# Patient Record
Sex: Female | Born: 1995 | Race: Black or African American | Hispanic: No | Marital: Single | Smoking: Never smoker
Health system: Southern US, Community
[De-identification: ages and names within clinical notes are randomized; demographics above are authoritative.]

## PROBLEM LIST (undated history)

## (undated) DIAGNOSIS — J309 Allergic rhinitis, unspecified: Secondary | ICD-10-CM

## (undated) DIAGNOSIS — G43109 Migraine with aura, not intractable, without status migrainosus: Secondary | ICD-10-CM

## (undated) DIAGNOSIS — Z8709 Personal history of other diseases of the respiratory system: Secondary | ICD-10-CM

## (undated) DIAGNOSIS — D649 Anemia, unspecified: Secondary | ICD-10-CM

## (undated) HISTORY — PX: LASIK: SHX215

## (undated) HISTORY — PX: MOUTH SURGERY: SHX715

## (undated) HISTORY — PX: WISDOM TOOTH EXTRACTION: SHX21

## (undated) HISTORY — DX: Allergic rhinitis, unspecified: J30.9

## (undated) HISTORY — DX: Personal history of other diseases of the respiratory system: Z87.09

## (undated) HISTORY — DX: Migraine with aura, not intractable, without status migrainosus: G43.109

---
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
## 2013-01-06 ENCOUNTER — Ambulatory Visit: Payer: BC Managed Care – PPO | Admitting: Family Medicine

## 2013-02-03 ENCOUNTER — Ambulatory Visit (INDEPENDENT_AMBULATORY_CARE_PROVIDER_SITE_OTHER): Payer: BC Managed Care – PPO

## 2013-02-03 DIAGNOSIS — Z23 Encounter for immunization: Secondary | ICD-10-CM

## 2013-02-04 ENCOUNTER — Telehealth: Payer: Self-pay | Admitting: Family Medicine

## 2013-02-04 NOTE — Telephone Encounter (Signed)
Form faxed to number listed below. Mom was notified. 

## 2013-02-04 NOTE — Telephone Encounter (Signed)
Patient needs copy of shot record faxed to father    Fax number [phone]

## 2013-07-23 ENCOUNTER — Encounter: Payer: Self-pay | Admitting: Family Medicine

## 2013-07-23 ENCOUNTER — Ambulatory Visit (INDEPENDENT_AMBULATORY_CARE_PROVIDER_SITE_OTHER): Payer: BC Managed Care – PPO | Admitting: Family Medicine

## 2013-07-23 VITALS — BP 106/62 | Ht 64.0 in | Wt 152.5 lb

## 2013-07-23 DIAGNOSIS — D649 Anemia, unspecified: Secondary | ICD-10-CM

## 2013-07-23 DIAGNOSIS — N920 Excessive and frequent menstruation with regular cycle: Secondary | ICD-10-CM

## 2013-07-23 DIAGNOSIS — D509 Iron deficiency anemia, unspecified: Secondary | ICD-10-CM

## 2013-07-23 DIAGNOSIS — F5089 Other specified eating disorder: Secondary | ICD-10-CM

## 2013-07-23 LAB — POCT HEMOGLOBIN: Hemoglobin: 6.8 g/dL — AB (ref 12.2–16.2)

## 2013-07-23 MED ORDER — LEVONORGEST-ETH ESTRAD 91-DAY 0.15-0.03 MG PO TABS: 1.0000 | ORAL_TABLET | Freq: Every day | ORAL | Status: DC

## 2013-07-23 NOTE — Progress Notes (Signed)
   Subjective:    Patient ID: Faith Huff, female    DOB: 11/29/1995, 18 y.o.   MRN: [id number]  HPI Patient is here today because she has been experiencing fatigue and lightheadedness for about several months now. She thinks that it is related to her anemia because her iron levels run low and she takes daily iron supplements for this. Patient's hemoglobin today is 6.8. Patient has a disorder called pica and has been eating and craving soap.   Feels weak with exertion  Menses are not very heavy but protrated  Not sexually active  Takes iron tabs on occasion  Reports diminished energy progressive over a number of months. Cycles at times her heavy. Also associated cramping. Patient has come off the birth control pills recently. She just restarted in a pack and still has a couple weeks of the Seasonale left.  Results for orders placed in visit on 07/23/13  POCT HEMOGLOBIN      Result Value Range   Hemoglobin 6.8 (*) 12.2 - 16.2 g/dL     Review of Systems  no chest pain no headache no back pain no change in bowel habits ROS otherwise negative    Objective:   Physical Exam  Alert no apparent distress. H&T normal conjunctiva pale. Lungs clear. Heart regular in rhythm. Abdomen soft ankles without edema      Assessment & Plan:  Impression 1 severe anemia discussed at length. Easily 25 minutes spent most in discussion. Anemia is multifactorial #2 pica nature of this discussed. Need to have been in pica habits discussed at length. #3 menorrhagia contributing to the anemia. #4 diet patient states she never eats red meat and mostly vegetables. Plan iron gluconate tablets twice a day faithfully. Multivitamin with folic acid daily faithfully. Maintain Seasonale rationale discussed. Recheck in one month we'll repeat hemoglobin then. WSL

## 2013-07-23 NOTE — Patient Instructions (Signed)
Iron gluconate tablet, take one tab twice daily faithfull  multivit with folic acid take one each morn faithfully  Stop uncommon soap intake  Maintain birth control pills  Come back in 4 to 6 wks as scheduled

## 2013-08-28 ENCOUNTER — Ambulatory Visit: Payer: BC Managed Care – PPO | Admitting: Family Medicine

## 2013-09-21 ENCOUNTER — Encounter: Payer: Self-pay | Admitting: Family Medicine

## 2013-09-21 ENCOUNTER — Ambulatory Visit (INDEPENDENT_AMBULATORY_CARE_PROVIDER_SITE_OTHER): Payer: BC Managed Care – PPO | Admitting: Family Medicine

## 2013-09-21 VITALS — BP 120/78 | Ht 64.0 in | Wt 154.6 lb

## 2013-09-21 DIAGNOSIS — D509 Iron deficiency anemia, unspecified: Secondary | ICD-10-CM

## 2013-09-21 DIAGNOSIS — D649 Anemia, unspecified: Secondary | ICD-10-CM

## 2013-09-21 DIAGNOSIS — F5089 Other specified eating disorder: Secondary | ICD-10-CM

## 2013-09-21 LAB — POCT HEMOGLOBIN: Hemoglobin: 10.6 g/dL — AB (ref 12.2–16.2)

## 2013-09-21 NOTE — Progress Notes (Signed)
   Subjective:    Patient ID: Faith Huff, female    DOB: 10/19/1995, 18 y.o.   MRN: [id number]  HPI Patient arrives for a follow up on her hemoglobin. Hgb much improved at 10.6 from 6.8 at last visit. Patient reports she is feeling better and still taking her iron tablets. Results for orders placed in visit on 09/21/13  POCT HEMOGLOBIN      Result Value Ref Range   Hemoglobin 10.6 (*) 12.2 - 16.2 g/dL   Took cut back , Less sob no patient also has irregular periods when starting the new birth control pill.  Trying to experience less plica.  No easy bleeding anywhere.  Energy level overall improved. Review of Systems No chest pain no back pain no abdominal pain no change about habits no blood in stool    Objective:   Physical Exam  Alert no apparent distress. Lungs clear. Heart regular in rhythm. H&T normal.      Assessment & Plan:  Impression anemia much improved plan not down iron tablets one daily. Your menstrual cycles discussed maintain same birth control pill for full 90 day cycle. Rationale discussed.

## 2013-12-11 ENCOUNTER — Telehealth: Payer: Self-pay | Admitting: Family Medicine

## 2013-12-11 NOTE — Telephone Encounter (Signed)
Patient needs shot record. °

## 2013-12-11 NOTE — Telephone Encounter (Signed)
Shot record printed and left up front for pick up. Patient notified.

## 2014-02-02 ENCOUNTER — Ambulatory Visit (INDEPENDENT_AMBULATORY_CARE_PROVIDER_SITE_OTHER): Payer: BC Managed Care – PPO | Admitting: Family Medicine

## 2014-02-02 ENCOUNTER — Encounter: Payer: Self-pay | Admitting: Family Medicine

## 2014-02-02 VITALS — BP 118/80 | Temp 98.5°F | Ht 64.0 in | Wt 150.4 lb

## 2014-02-02 DIAGNOSIS — D649 Anemia, unspecified: Secondary | ICD-10-CM

## 2014-02-02 LAB — POCT HEMOGLOBIN: Hemoglobin: 10.1 g/dL — AB (ref 12.2–16.2)

## 2014-02-02 MED ORDER — NORETHINDRONE-ETH ESTRADIOL 1-35 MG-MCG PO TABS: 1.0000 | ORAL_TABLET | Freq: Every day | ORAL | Status: DC

## 2014-02-02 NOTE — Progress Notes (Signed)
   Subjective:    Patient ID: Faith MorrowGloriah H Shrieves, female    DOB: 12/24/1995, 18 y.o.   MRN: [id number]  HPI Patient is here today because the current birth control pill she is taking does not seem to be working. Patient states that her period has been on for over 1 month now. She suffers with anemia and feels that this is not helping it at all.  Patient states she has no other concerns at this time.  Hemoglobin is 10.1.  Light for several weeks   Exercises once per wk  Energy level or decent  Avoiding soap etc.    Takes one iron tab per day, visited per wk.  Results for orders placed in visit on 02/02/14  POCT HEMOGLOBIN      Result Value Ref Range   Hemoglobin 10.1 (*) 12.2 - 16.2 g/dL    Review of Systems No chest pain no headache no back pain no abdominal pain no change in bowel habits    Objective:   Physical Exam  Alert no apparent distress. Vitals stable. HEENT normal. Lungs clear. Heart regular in rhythm.      Assessment & Plan:  Impression iron deficiency anemia still suboptimal in. Multifactorial in etiology. #2 menorrhagia. #3 plica result per patient plan stop Seasonale. Start Ortho-Novum one/35. Increase iron gluconate to twice a day. Add multivitamin daily until further notice. WSL

## 2014-02-02 NOTE — Patient Instructions (Signed)
Increase iron gluconate to twice per day  Add multivitamin daily

## 2015-11-28 ENCOUNTER — Other Ambulatory Visit (HOSPITAL_COMMUNITY): Payer: Self-pay | Admitting: Oncology

## 2015-11-28 ENCOUNTER — Encounter: Payer: Self-pay | Admitting: Family Medicine

## 2015-11-28 ENCOUNTER — Ambulatory Visit (INDEPENDENT_AMBULATORY_CARE_PROVIDER_SITE_OTHER): Payer: BLUE CROSS/BLUE SHIELD | Admitting: Family Medicine

## 2015-11-28 VITALS — BP 110/60 | Temp 98.5°F | Ht 64.0 in | Wt 150.4 lb

## 2015-11-28 DIAGNOSIS — D649 Anemia, unspecified: Secondary | ICD-10-CM

## 2015-11-28 DIAGNOSIS — R5383 Other fatigue: Secondary | ICD-10-CM | POA: Diagnosis not present

## 2015-11-28 DIAGNOSIS — D509 Iron deficiency anemia, unspecified: Secondary | ICD-10-CM

## 2015-11-28 DIAGNOSIS — Z8659 Personal history of other mental and behavioral disorders: Secondary | ICD-10-CM | POA: Diagnosis not present

## 2015-11-28 LAB — POCT HEMOGLOBIN: Hemoglobin: 5.5 g/dL — AB (ref 12.2–16.2)

## 2015-11-28 NOTE — Patient Instructions (Signed)
Iron sulfate 325 mg equivalent two tabs twice per day (ask pharmacist)  multivit with folic acid (one mg) one each morn  Three aleave twice per d with food during menstrual cycle  rec consider not working if you continue to feel bad because your hgb is very low which makes you prone to fatigue and lightheadedness

## 2015-11-28 NOTE — Progress Notes (Signed)
   Subjective:    Patient ID: Faith Huff, female    DOB: 12/18/1995, 20 y.o.   MRN: [id number]  HPI Patient is here today for extreme fatigue. Onset several months ago. Patient has no other concerns at this time.   This morn felt lightheaded, took three iron tabs,   [phone]  Results for orders placed or performed in visit on 11/28/15  POCT hemoglobin  Result Value Ref Range   Hemoglobin 5.5 (A) 12.2 - 16.2 g/dL   Patient has history of fatigue and tiredness. Next  Also history of hypo-. Next  History menorrhagia. Used birth control pills 4. But then did not like to wait making her feel.  Substantial he has history of pica. For this patient was primarily soap, patient claims she has not gone back to soak this time  A lot of stress this semester. With fatigue and tiredness has not been able to focuses well in school. Findings result getting the honor classes.  Review of Systems Lightheaded spell today. Felt like she might want to pass out. Working in a [employer] industry no headache no chest pain no back pain    Objective:   Physical Exam  Alert vital stable HEENT slightly pale eyelids pharynx normal neck supple lungs clear. Heart regular in rhythm affect appropriate      Assessment & Plan:  Impression severe anemia though patient denying pica this is often a secondary feature during periods of stress with folks with an underlying tendency, would not be surprised if hard of the prominent this time. #2 history of iron deficiency #3 menorrhagia plan iron supplement plus folic acid and multivitamin. Family asks I dictated a letter regarding her poor academic performance this spring in light of her medical problems. I'll do this. Also we'll set up hematology referral due to recurrent and sometimes severe nature patient's anemia.

## 2015-11-29 ENCOUNTER — Encounter (HOSPITAL_COMMUNITY): Payer: BLUE CROSS/BLUE SHIELD | Attending: Hematology & Oncology

## 2015-11-29 ENCOUNTER — Other Ambulatory Visit: Payer: Self-pay | Admitting: *Deleted

## 2015-11-29 ENCOUNTER — Telehealth: Payer: Self-pay | Admitting: Family Medicine

## 2015-11-29 DIAGNOSIS — D649 Anemia, unspecified: Secondary | ICD-10-CM | POA: Diagnosis not present

## 2015-11-29 DIAGNOSIS — N920 Excessive and frequent menstruation with regular cycle: Secondary | ICD-10-CM | POA: Insufficient documentation

## 2015-11-29 DIAGNOSIS — D509 Iron deficiency anemia, unspecified: Secondary | ICD-10-CM

## 2015-11-29 LAB — BASIC METABOLIC PANEL
BUN/Creatinine Ratio: 10 (ref 9–23)
BUN: 6 mg/dL (ref 6–20)
CO2: 24 mmol/L (ref 18–29)
Calcium: 9.3 mg/dL (ref 8.7–10.2)
Chloride: 104 mmol/L (ref 96–106)
Creatinine, Ser: 0.6 mg/dL (ref 0.57–1.00)
GFR calc Af Amer: 151 mL/min/{1.73_m2} (ref >59)
GFR calc non Af Amer: 131 mL/min/{1.73_m2} (ref >59)
Glucose: 99 mg/dL (ref 65–99)
Potassium: 4.3 mmol/L (ref 3.5–5.2)
Sodium: 142 mmol/L (ref 134–144)

## 2015-11-29 LAB — HEPATIC FUNCTION PANEL
ALT: 6 IU/L (ref 0–32)
AST: 11 IU/L (ref 0–40)
Albumin: 4.4 g/dL (ref 3.5–5.5)
Alkaline Phosphatase: 53 IU/L (ref 39–117)
Bilirubin Total: <0.2 mg/dL (ref 0.0–1.2)
Bilirubin, Direct: 0.07 mg/dL (ref 0.00–0.40)
Total Protein: 7.1 g/dL (ref 6.0–8.5)

## 2015-11-29 LAB — CBC WITH DIFFERENTIAL/PLATELET
Basophils Absolute: 0.1 10*3/uL (ref 0.0–0.2)
Basos: 1 %
EOS (ABSOLUTE): 0.1 10*3/uL (ref 0.0–0.4)
Eos: 1 %
Hematocrit: 27.2 % — ABNORMAL LOW (ref 34.0–46.6)
Hemoglobin: 7.5 g/dL — ABNORMAL LOW (ref 11.1–15.9)
Immature Grans (Abs): 0 10*3/uL (ref 0.0–0.1)
Immature Granulocytes: 0 %
Lymphocytes Absolute: 1.9 10*3/uL (ref 0.7–3.1)
Lymphs: 21 %
MCH: 17.4 pg — ABNORMAL LOW (ref 26.6–33.0)
MCHC: 27.6 g/dL — ABNORMAL LOW (ref 31.5–35.7)
MCV: 63 fL — ABNORMAL LOW (ref 79–97)
Monocytes Absolute: 0.7 10*3/uL (ref 0.1–0.9)
Monocytes: 7 %
Neutrophils Absolute: 6.4 10*3/uL (ref 1.4–7.0)
Neutrophils: 70 %
Platelets: 251 10*3/uL (ref 150–379)
RBC: 4.31 x10E6/uL (ref 3.77–5.28)
RDW: 19.9 % — ABNORMAL HIGH (ref 12.3–15.4)
WBC: 9.1 10*3/uL (ref 3.4–10.8)

## 2015-11-29 LAB — RETICULOCYTES
RBC.: 4.01 MIL/uL (ref 3.87–5.11)
Retic Count, Absolute: 112.3 10*3/uL (ref 19.0–186.0)
Retic Ct Pct: 2.8 % (ref 0.4–3.1)

## 2015-11-29 LAB — VITAMIN B12: Vitamin B-12: 284 pg/mL (ref 211–946)

## 2015-11-29 LAB — SAVE SMEAR

## 2015-11-29 LAB — LACTATE DEHYDROGENASE: LDH: 127 U/L (ref 98–192)

## 2015-11-29 LAB — FERRITIN: Ferritin: 7 ng/mL — ABNORMAL LOW (ref 15–150)

## 2015-11-29 LAB — FOLATE: Folate: 9.5 ng/mL (ref >3.0)

## 2015-11-29 LAB — TSH: TSH: 0.777 u[IU]/mL (ref 0.450–4.500)

## 2015-11-29 LAB — T4: T4, Total: 7.5 ug/dL (ref 4.5–12.0)

## 2015-11-29 NOTE — Telephone Encounter (Signed)
Ok prescrive prenatal type vit, if insure does not cover, ptc can go otc with folic acid but at a lesser dose

## 2015-11-29 NOTE — Telephone Encounter (Signed)
Script sent to pharm. Pt notified on voicemail.  

## 2015-11-29 NOTE — Telephone Encounter (Signed)
Multi vit w/folic acid 1 mg  The Pharmacist says she can not get that  OTC it will need to be prescribed   [facility]

## 2015-11-30 ENCOUNTER — Other Ambulatory Visit: Payer: Self-pay | Admitting: *Deleted

## 2015-11-30 ENCOUNTER — Telehealth: Payer: Self-pay | Admitting: Family Medicine

## 2015-11-30 DIAGNOSIS — N920 Excessive and frequent menstruation with regular cycle: Secondary | ICD-10-CM

## 2015-11-30 LAB — PATHOLOGIST SMEAR REVIEW

## 2015-11-30 LAB — HAPTOGLOBIN: Haptoglobin: 76 mg/dL (ref 34–200)

## 2015-11-30 NOTE — Telephone Encounter (Signed)
Pt was to get a letter from you to state that due to her  Extreme fatigue and sickness that it may have impacted her  Ability to do her school work as she was required to do.   Please advise when this will be done, they are calling this  Morning to inquire about it, mom states you told her it would Be done today.

## 2015-11-30 NOTE — Telephone Encounter (Signed)
Pt notified she can pickup letter after 1 today

## 2015-11-30 NOTE — Telephone Encounter (Signed)
i said it would be done today, was waiting for blood work to bolster the case, blood work in last night , letter will be ready by after lunch,

## 2015-12-02 ENCOUNTER — Telehealth: Payer: Self-pay | Admitting: Obstetrics and Gynecology

## 2015-12-02 NOTE — Telephone Encounter (Signed)
Called and left a message for patient to call back to schedule a new patient doctor referral. °

## 2015-12-06 NOTE — Telephone Encounter (Signed)
Called and left a message for patient to call back to schedule a new patient doctor referral. °

## 2015-12-07 ENCOUNTER — Ambulatory Visit (INDEPENDENT_AMBULATORY_CARE_PROVIDER_SITE_OTHER): Payer: BLUE CROSS/BLUE SHIELD | Admitting: Obstetrics and Gynecology

## 2015-12-07 ENCOUNTER — Encounter: Payer: Self-pay | Admitting: Obstetrics and Gynecology

## 2015-12-07 ENCOUNTER — Encounter (HOSPITAL_COMMUNITY): Payer: Self-pay | Admitting: Oncology

## 2015-12-07 ENCOUNTER — Encounter (HOSPITAL_BASED_OUTPATIENT_CLINIC_OR_DEPARTMENT_OTHER): Payer: BLUE CROSS/BLUE SHIELD | Admitting: Oncology

## 2015-12-07 ENCOUNTER — Ambulatory Visit: Payer: BLUE CROSS/BLUE SHIELD | Admitting: Obstetrics and Gynecology

## 2015-12-07 VITALS — BP 98/60 | HR 84 | Resp 15 | Ht 64.0 in | Wt 149.0 lb

## 2015-12-07 VITALS — BP 104/62 | HR 68 | Temp 97.9°F | Resp 20 | Ht 64.0 in | Wt 149.2 lb

## 2015-12-07 DIAGNOSIS — Z01419 Encounter for gynecological examination (general) (routine) without abnormal findings: Secondary | ICD-10-CM

## 2015-12-07 DIAGNOSIS — D509 Iron deficiency anemia, unspecified: Secondary | ICD-10-CM

## 2015-12-07 DIAGNOSIS — N920 Excessive and frequent menstruation with regular cycle: Secondary | ICD-10-CM | POA: Diagnosis not present

## 2015-12-07 DIAGNOSIS — F5089 Other specified eating disorder: Secondary | ICD-10-CM | POA: Diagnosis not present

## 2015-12-07 DIAGNOSIS — Z124 Encounter for screening for malignant neoplasm of cervix: Secondary | ICD-10-CM

## 2015-12-07 MED ORDER — NORETHIN ACE-ETH ESTRAD-FE 1-20 MG-MCG PO TABS: 1.0000 | ORAL_TABLET | Freq: Every day | ORAL | Status: DC

## 2015-12-07 MED ORDER — NAPROXEN SODIUM 550 MG PO TABS: ORAL_TABLET | ORAL | Status: DC

## 2015-12-07 NOTE — Assessment & Plan Note (Addendum)
Microcytic, hypochromic anemia with elevated RDW with preservation of WBC and platelet count dating back to at least 2011 with a documented ferritin level of 7 on 11/28/2015.  Also noted to have a low-normal B12 level.  Labs to be drawn the day of her first IV iron infusion to avoid repeated venipunctures: CBC diff, CMET, retic count, intrinsic factor antibody and anti-parietal cell antibody, ESR, CRP, pathology smear review.  B12 level is low normal.  Patient is advised to not start oral B-vitamin replacement at this time as it will alter antibody testing results.  She is not deficient.  She is educated that we will provide further recommendations in this regard at follow-up appointment (sooner if necessary).  She is currently on OTC ferrous sulfate without known intolerance issues.  She may continue with this if she wishes.   Stool cards x 3.  Calculated iron deficit is ~ 950 mg and therefore, we will administer 510 mg IV Feraheme x 2.  Patient and mother are provided education regarding iron deficiency anemia.  Patient and mother discussed birth control options.  I get the impression that the patient's mother may be concerned about compliance and therefore, targeted questions regarding depo-provera were asked from the patient's mother.  We think this is a reasonable birth control option to help menorrhagia and improve control of menstruation and cycles.  Labs in ~ 4-6 weeks: CBC diff, iron/TIBC, ferritin.  Continue follow-up with Gyn for evaluation/treatment/recommendations regarding menorrhagia.  She saw Dr. Talbert Nan today, 12/07/2015: A: Well Woman with normal exam Menorrhagia with severe anemia  P: She is seeing hematologist today to r/o other causes of anemia, recommend she have an iron transfusion Return for a gyn ultrasound (abdominal only) Discussed options for treating her menorrhagia, including OCP's, Micronor, depo-provera and  IUD's (would need to do with sedation) Anaprox for cramps  Return in 4-6 weeks for follow-up.  I will send a copy of today's note to Dr. Wolfgang Phoenix (PCP) and Dr. Talbert Nan (Gynecology).

## 2015-12-07 NOTE — Patient Instructions (Signed)

## 2015-12-07 NOTE — Progress Notes (Signed)
Patient ID: Faith MorrowGloriah H Arps, female   DOB: 09/23/1995, 20 y.o.   MRN: [id number] 20 y.o. [id number] SingleAfrican AmericanF here c/o heavy menstrual cycles and anemia.  Recent hgb of 7.5. Normal TSH. She has an appointment with hematology today. She has had anemia since middle school. She has been having fatigue unable to finish her school paper needed for graduation. She is not able to function normally. She had a pre-syncopal event a few weeks ago on the first day of her last period. Not SOB.  Period Cycle (Days): 28 Period Duration (Days): 5-6 days  Period Pattern: Regular Menstrual Flow: Heavy Menstrual Control: Maxi pad Menstrual Control Change Freq (Hours): changes pad every hour  Dysmenorrhea: (!) Severe Dysmenorrhea Symptoms: Cramping, Nausea  No BTB. Cramps are severe for 1 day. She takes Alieve. She was on OCP's for 6 months, didn't help her bleeding, felt premenstrual all the time. Never sexually active. She has never had a gyn ultrasound.   Patient's last menstrual period was 11/28/2015.          Sexually active: No.  The current method of family planning is abstinence.    Exercising: No.  The patient does not participate in regular exercise at present. Smoker:  no  Health Maintenance: Pap:  Never History of abnormal Pap:  N/A TDaP:  2013 Gardasil: completed all 3    reports that she has never smoked. She has never used smokeless tobacco. She reports that she does not drink alcohol or use illicit drugs.One paper away from Graduation. Majoring in elementary degree, ESL and Congohinese.   Past Medical History  Diagnosis Date  . Anemia   . Allergic rhinitis   . History of reactive airway disease     Past Surgical History  Procedure Laterality Date  . Wisdom tooth extraction    . Mouth surgery      Current Outpatient Prescriptions  Medication Sig Dispense Refill  . Iron TABS Take by mouth daily.     No current facility-administered medications for this visit.     History reviewed. No pertinent family history.  Review of Systems  Constitutional: Positive for fatigue.  HENT: Negative.   Eyes: Negative.   Respiratory: Negative.   Cardiovascular: Negative.   Gastrointestinal: Negative.   Endocrine: Negative.   Genitourinary: Positive for menstrual problem.       Heavy menstrual cycles   Musculoskeletal: Negative.   Skin: Negative.   Allergic/Immunologic: Negative.   Neurological: Negative.   Psychiatric/Behavioral: Negative.     Exam:   BP 98/60 mmHg  Pulse 84  Resp 15  Ht 5\' 4"  (1.626 m)  Wt 149 lb (67.586 kg)  BMI 25.56 kg/m2  LMP 11/28/2015  Weight change: @WEIGHTCHANGE @ Height:   Height: 5\' 4"  (162.6 cm)  Ht Readings from Last 3 Encounters:  12/07/15 5\' 4"  (1.626 m)  11/28/15 5\' 4"  (1.626 m)  02/02/14 5\' 4"  (1.626 m) (45 %*, Z = -0.12)   * Growth percentiles are based on CDC 2-20 Years data.    General appearance: alert, cooperative and appears stated age Head: Normocephalic, without obvious abnormality, atraumatic Neck: no adenopathy, supple, symmetrical, trachea midline and thyroid normal to inspection and palpation Lungs: clear to auscultation bilaterally Breasts: normal appearance, no masses or tenderness Heart: regular rate and rhythm Abdomen: soft, non-tender; bowel sounds normal; no masses,  no organomegaly Extremities: extremities normal, atraumatic, no cyanosis or edema Skin: Skin color, texture, turgor normal. No rashes or lesions Lymph nodes: Cervical, supraclavicular, and axillary nodes normal.  No abnormal inguinal nodes palpated Neurologic: Grossly normal   Pelvic: External genitalia:  no lesions              Urethra:  normal appearing urethra with no masses, tenderness or lesions              Bartholins and Skenes: normal                 Vagina: normal appearing vagina with normal color and discharge, no lesions. Able to tolerate a pediatric speculum              Cervix: no lesions                Bimanual Exam: done via rectal exam, patient unable to tolerate vaginal exam with 1 finger Uterus:  normal size, contour, position, consistency, mobility, non-tender              Adnexa: no mass, fullness, tenderness               Rectovaginal: Confirms               Anus:  normal sphincter tone, no lesions  Chaperone was present for exam.  A:  Well Woman with normal exam  Menorrhagia with severe anemia  P:   She is seeing hematologist today to r/o other causes of anemia, recommend she have an iron transfusion  Return for a gyn ultrasound (abdominal only)  Discussed options for treating her menorrhagia, including OCP's, Micronor, depo-provera and IUD's (would need to do with sedation)  She would like to try OCP's, no contraindications, risks reviewed  Anaprox for cramps  Pap done  No STD testing needed  In addition to the ultrasound, she should return in 3 months for a pill check

## 2015-12-07 NOTE — Patient Instructions (Addendum)
[place] at [facility]  Discharge Instructions:  Labs prior to IV iron infusion (CBC diff, retic count, intrinsic factor antibody, anti-parietal cell antibody, ESR, CRP, path smear review)  Stool cards x 3. Bring back with you when you return.   Feraheme 571m IV x 2 (1 week apart)  Return in 6 weeks for labs: CBC diff, Iron/TIBC, Ferritin  Return in 6 weeks for a f/u with Dr. PWhitney Muse    _______________________________________________________________  [facility] you [facility] and [facility] care.  To afford each patient quality time with our providers, please arrive at least 15 minutes before your scheduled appointment.  You need to re-schedule your appointment if you arrive 10 or more minutes late.  We strive to give you quality time with our providers, and arriving late affects you and other patients whose appointments are after yours.  Also, if you no show three or more times for appointments you may be dismissed from the clinic.  Again, thank you for choosing [facility] that these requests will allow you access to exceptional care and in a timely manner. _______________________________________________________________  If you have questions after your visit, please contact our office at [phone] between the hours of 8:30 a.m. and 5:00 p.m. Voicemails left after 4:30 p.m. will not be returned until the following business day. _______________________________________________________________  For prescription refill requests, have your pharmacy contact our office. _______________________________________________________________  Recommendations made by the consultant and any test results will be sent to your referring physician. _______________________________________________________________

## 2015-12-07 NOTE — Progress Notes (Signed)
[facility] Hematology/Oncology Consultation   Name: Faith Huff      MRN: [id number]    Location: Room/bed info not found  Date: 12/07/2015 Time:5:50 PM   REFERRING PHYSICIAN:  Dr. Wolfgang Phoenix (Primary Care Provider)  REASON FOR CONSULT:  Anemia   DIAGNOSIS:  Microcytic, hypochromic anemia with an elevated RDW with preservation of WBC and platelet counts.  Last documented ferritin is 7 on 11/28/2015.  HISTORY OF PRESENT ILLNESS:   Faith Huff is a 20 y.o. female with a medical history significant for menorrhagia who is referred to the [facility] for anemia.  I personally reviewed and went over laboratory results with the patient.  The results are noted within this dictation.  Labs are impressive for a microcytic, hypochromic anemia with elevated RDW with preservation of WBC and platelet count dating back to at least 2011.  I personally reviewed and went over radiographic studies with the patient.  The results are noted within this dictation.  Noncontributory for current diagnosis.  Chart reviewed.  She was sen by Gynecology today.  Dr. Gentry Fitz note is reviewed in detail: A: Well Woman with normal exam Menorrhagia with severe anemia  P: She is seeing hematologist today to r/o other causes of anemia, recommend she have an iron transfusion Return for a gyn ultrasound (abdominal only) Discussed options for treating her menorrhagia, including OCP's, Micronor, depo-provera and IUD's (would need to do with sedation) Anaprox for cramps  Review of Systems  Constitutional: Negative.  Negative for fever, chills, weight loss, malaise/fatigue and diaphoresis.  HENT: Negative.  Negative for congestion, nosebleeds and sore throat.   Eyes: Negative.  Negative for blurred vision and double vision.  Respiratory: Negative.  Negative for cough, hemoptysis, sputum production, shortness of breath,  wheezing and stridor.   Cardiovascular: Negative.  Negative for chest pain and leg swelling.  Gastrointestinal: Negative.  Negative for nausea, vomiting, abdominal pain, diarrhea, constipation, blood in stool and melena.  Genitourinary: Negative.  Negative for dysuria, urgency, frequency and hematuria.  Musculoskeletal: Negative.  Negative for back pain and falls.  Skin: Negative.  Negative for itching and rash.  Neurological: Negative.  Negative for dizziness, tingling, tremors, sensory change, speech change, focal weakness, loss of consciousness and weakness.  Endo/Heme/Allergies: Negative.  Does not bruise/bleed easily.  Psychiatric/Behavioral: Negative.  Negative for substance abuse.   She reports menorrhagia and denies vaginal bleeding between her menstrual cycles.  She notes that her [facility] was on 11/27/2015.  She notes using 10-12 tampons/pads during her heavy flow days.  Her periods last about 5 days and she is regular every 28-32 days.  She notes that her heavy flows are 3 out of 5 days of her menses.  She denies any pregnancy.  She reports menstruation beginning in 5th grade.  She reports craving soap bars, favoring the [place] brand.  The last time she ate soap was in the summer 2016.   She denies craving soap today.  She denies craving liquid soap.  She denies ice, clay, dirt, concrete, starch cravings.  The patient and her mother who accompanied the patient today are provided education regarding anemia and more specifically iron deficiency anemia.  Iron deficiency anemia is the most common anemia.  Beside playing a critical role as an oxygen carrier in the heme group of hemoglobin, iron is found in many key proteins in the cells, such as cytochromes and myoglobin, so it is not unexpected that a lack of  iron has effects other than anemia.  Three studies have focused on nonanemic iron deficiency leading to fatigue.  Two studies showed that oral iron supplementation reduces fatigue, with no  significant change in hemoglobin levels, in women with a ferritin level of less than 50 ng/mL, and a third study showed a lessening of fatigue with parental iron administration in women with a ferritin level of 15 ng/mL or less or an iron saturation of 20% or less.   Owing to obligate iron loss through menses, women are at greater risk for iron deficiency than men.  Iron loss in all women averages 1-3 ng per day, and dietary intake is often inadequate to maintain a positive iron balance.  A 1967 study showed that 25% of healthy, college-age women had no bone marrow iron stores and that another 33% had low stores.    -NEJM Volume 371, No 14, pg 1325-1326   PAST MEDICAL HISTORY:   Past Medical History  Diagnosis Date  . Anemia   . Allergic rhinitis   . History of reactive airway disease     ALLERGIES: No Known Allergies    MEDICATIONS: I have reviewed the patient's current medications.    Current Outpatient Prescriptions on File Prior to Visit  Medication Sig Dispense Refill  . Iron TABS Take by mouth daily.     No current facility-administered medications on file prior to visit.     PAST SURGICAL HISTORY Past Surgical History  Procedure Laterality Date  . Wisdom tooth extraction    . Mouth surgery      FAMILY HISTORY: Family History  Problem Relation Age of Onset  . Anemia Mother   Mother is 74 years old and has IDA. Father is 28 years old.  SOCIAL HISTORY:  reports that she has never smoked. She has never used smokeless tobacco. She reports that she does not drink alcohol or use illicit drugs.  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    Social History   Social History  . Marital Status: Single    Spouse Name: N/A  . Number of Children: N/A  . Years of Education: N/A   Social History Main Topics  . Smoking status: Never Smoker   . Smokeless tobacco: Never Used  . Alcohol Use: No  . Drug Use: No  . Sexual Activity: No     Other Topics Concern  . None   Social History Narrative    PERFORMANCE STATUS: The patient's performance status is 0 - Asymptomatic  PHYSICAL EXAM: Most Recent Vital Signs: Blood pressure 104/62, pulse 68, temperature 97.9 F (36.6 C), temperature source Oral, resp. rate 20, height '5\' 4"'$  (1.626 m), weight 149 lb 3.2 oz (67.677 kg), last menstrual period 11/28/2015, SpO2 100 %. General appearance: alert, cooperative, appears stated age, no distress and anxious, accompanied by her mother. Head: Normocephalic, without obvious abnormality, atraumatic Eyes: negative findings: lids and lashes normal, conjunctivae and sclerae normal and corneas clear Throat: lips, mucosa, and tongue normal; teeth and gums normal Neck: no adenopathy, supple, symmetrical, trachea midline and thyroid not enlarged, symmetric, no tenderness/mass/nodules Lungs: clear to auscultation bilaterally and normal percussion bilaterally Heart: regular rate and rhythm, S1, S2 normal, no murmur, click, rub or gallop Abdomen: soft, non-tender; bowel sounds normal; no masses,  no organomegaly Extremities: extremities normal, atraumatic, no cyanosis or edema Skin: Skin color, texture, turgor normal. No rashes or lesions or henna tatooing on her upper extremitites bilaterally. Lymph nodes: Cervical,  supraclavicular, and axillary nodes normal. Neurologic: Grossly normal  LABORATORY DATA:  CBC    Component Value Date/Time   WBC 9.1 11/28/2015 1547   WBC 6.7 11/09/2011 1749   RBC 4.01 11/29/2015 1320   RBC 4.31 11/28/2015 1547   RBC 4.73 11/09/2011 1749   HGB 5.5* 11/28/2015 1500   HGB 12.6 11/09/2011 1816   HCT 27.2* 11/28/2015 1547   HCT 37.0 11/09/2011 1816   PLT 251 11/28/2015 1547   PLT 476* 11/09/2011 1749   MCV 63* 11/28/2015 1547   MCV 71.5* 11/09/2011 1749   MCH 17.4* 11/28/2015 1547   MCH 22.0* 11/09/2011 1749   MCHC 27.6* 11/28/2015 1547   MCHC 30.8* 11/09/2011 1749   RDW 19.9* 11/28/2015 1547   RDW  17.2* 11/09/2011 1749   LYMPHSABS 1.9 11/28/2015 1547   LYMPHSABS 0.4* 01/07/2010 1625   MONOABS 0.5 01/07/2010 1625   EOSABS 0.1 11/28/2015 1547   EOSABS 0.0 01/07/2010 1625   BASOSABS 0.1 11/28/2015 1547   BASOSABS 0.0 01/07/2010 1625      Chemistry      Component Value Date/Time   NA 142 11/28/2015 1547   NA 142 11/09/2011 1816   K 4.3 11/28/2015 1547   CL 104 11/28/2015 1547   CO2 24 11/28/2015 1547   BUN 6 11/28/2015 1547   BUN 9 11/09/2011 1816   CREATININE 0.60 11/28/2015 1547      Component Value Date/Time   CALCIUM 9.3 11/28/2015 1547   ALKPHOS 53 11/28/2015 1547   AST 11 11/28/2015 1547   ALT 6 11/28/2015 1547   BILITOT <0.2 11/28/2015 1547   BILITOT 0.5 01/07/2010 1625     Lab Results  Component Value Date   IRON 27* 09/04/2009   TIBC 456 09/04/2009   FERRITIN 7* 11/28/2015   Lab Results  Component Value Date   AVWUJWJX91 478 11/28/2015   Lab Results  Component Value Date   FOLATE 9.5 11/28/2015   Lab Results  Component Value Date   LDH 127 11/29/2015     RADIOGRAPHY: No results found.     PATHOLOGY:  N/A  ASSESSMENT/PLAN:  Menorrhagia Anemia, iron deficiency PICA  Microcytic, hypochromic anemia with elevated RDW with preservation of WBC and platelet count dating back to at least 2011 with a documented ferritin level of 7 on 11/28/2015.  Also noted to have a low-normal B12 level.  Labs to be drawn the day of her first IV iron infusion to avoid repeated venipunctures: CBC diff, CMET, retic count, intrinsic factor antibody and anti-parietal cell antibody, ESR, CRP, pathology smear review.  B12 level is low normal.  Patient is advised to not start oral B-vitamin replacement at this time as it will alter antibody testing results.  We will begin B12 replacement in the next few days when she returns for iron replacement. She is not severely deficient. She is educated that we will provide further recommendations in this regard at follow-up  appointment (sooner if necessary).  She is currently on OTC ferrous sulfate without known intolerance issues.  She may continue with this if she wishes. Not sure that she can take this tid as would be needed for replacement and ongoing losses.  Stool cards x 3.  Calculated iron deficit is ~ 950 mg and therefore, we will administer 510 mg IV Feraheme x 2.  Patient and mother are provided education regarding iron deficiency anemia.  Patient and mother discussed birth control options.  I get the impression that the patient's mother may be  concerned about compliance and therefore, targeted questions regarding depo-provera were asked from the patient's mother.  We think this is a reasonable birth control option to help menorrhagia and improve control of menstruation and cycles.  Labs in ~ 4-6 weeks: CBC diff, iron/TIBC, ferritin.  Continue follow-up with Gyn for evaluation/treatment/recommendations regarding menorrhagia.  She saw Dr. Talbert Nan today, 12/07/2015: A: Well Woman with normal exam Menorrhagia with severe anemia  P: She is seeing hematologist today to r/o other causes of anemia, recommend she have an iron transfusion Return for a gyn ultrasound (abdominal only) Discussed options for treating her menorrhagia, including OCP's, Micronor, depo-provera and IUD's (would need to do with sedation) Anaprox for cramps  Return in 4-6 weeks for follow-up.  I will send a copy of today's note to Dr. Wolfgang Phoenix (PCP) and Dr. Talbert Nan (Gynecology).    ORDERS PLACED FOR THIS ENCOUNTER: Orders Placed This Encounter  Procedures  . CBC with Differential  . Comprehensive metabolic panel  . Haptoglobin  . Intrinsic Factor Antibodies  . Anti-parietal antibody  . Reticulocytes  . Sedimentation rate  . Pathologist smear review  . C-reactive protein  . Occult blood card to lab, stool  . Occult blood card to lab, stool  . Occult blood card  to lab, stool  . CBC with Differential  . Comprehensive metabolic panel  . Iron and TIBC  . Ferritin    MEDICATIONS PRESCRIBED THIS ENCOUNTER: No orders of the defined types were placed in this encounter.    All questions were answered. The patient knows to call the clinic with any problems, questions or concerns. We can certainly see the patient much sooner if necessary.  This note is electronically signed by Molli Hazard, MD  12/07/2015 5:50 PM

## 2015-12-08 ENCOUNTER — Telehealth: Payer: Self-pay | Admitting: Obstetrics and Gynecology

## 2015-12-08 LAB — IPS PAP SMEAR ONLY

## 2015-12-08 NOTE — Telephone Encounter (Signed)
Spoke with pt regarding benefit for ultrasound. Patient understood and agreeable. Patient advises, she would like to discuss with her parents, this evening before scheduling. Patient will call back on 12/09/15 to schedule

## 2015-12-08 NOTE — Telephone Encounter (Signed)
Called patient to review benefits for a recommended procedure, however voicemail (this is the only number on file) was not set up and I was unable to leave a message.

## 2015-12-09 NOTE — Telephone Encounter (Signed)
Called patient to follow up on scheduling recommended ultrasound, however, I was unable to leaved a message due to voicemail not being set up

## 2015-12-11 ENCOUNTER — Encounter (HOSPITAL_COMMUNITY): Payer: Self-pay | Admitting: Oncology

## 2015-12-14 ENCOUNTER — Encounter (HOSPITAL_BASED_OUTPATIENT_CLINIC_OR_DEPARTMENT_OTHER): Payer: BLUE CROSS/BLUE SHIELD

## 2015-12-14 VITALS — BP 102/65 | HR 74 | Temp 98.2°F | Resp 18 | Ht 64.0 in | Wt 152.2 lb

## 2015-12-14 DIAGNOSIS — D509 Iron deficiency anemia, unspecified: Secondary | ICD-10-CM | POA: Diagnosis not present

## 2015-12-14 DIAGNOSIS — D649 Anemia, unspecified: Secondary | ICD-10-CM | POA: Diagnosis not present

## 2015-12-14 LAB — RETICULOCYTES
RBC.: 4.75 MIL/uL (ref 3.87–5.11)
Retic Count, Absolute: 118.8 10*3/uL (ref 19.0–186.0)
Retic Ct Pct: 2.5 % (ref 0.4–3.1)

## 2015-12-14 LAB — COMPREHENSIVE METABOLIC PANEL
ALT: 10 U/L — ABNORMAL LOW (ref 14–54)
AST: 17 U/L (ref 15–41)
Albumin: 4 g/dL (ref 3.5–5.0)
Alkaline Phosphatase: 47 U/L (ref 38–126)
Anion gap: 8 (ref 5–15)
BUN: 9 mg/dL (ref 6–20)
CO2: 26 mmol/L (ref 22–32)
Calcium: 9.2 mg/dL (ref 8.9–10.3)
Chloride: 104 mmol/L (ref 101–111)
Creatinine, Ser: 0.55 mg/dL (ref 0.44–1.00)
GFR calc Af Amer: >60 mL/min (ref >60)
GFR calc non Af Amer: >60 mL/min (ref >60)
Glucose, Bld: 62 mg/dL — ABNORMAL LOW (ref 65–99)
Potassium: 3.5 mmol/L (ref 3.5–5.1)
Sodium: 138 mmol/L (ref 135–145)
Total Bilirubin: 0.5 mg/dL (ref 0.3–1.2)
Total Protein: 7.5 g/dL (ref 6.5–8.1)

## 2015-12-14 LAB — C-REACTIVE PROTEIN: CRP: <0.5 mg/dL (ref <1.0)

## 2015-12-14 LAB — CBC WITH DIFFERENTIAL/PLATELET
Basophils Absolute: 0.1 10*3/uL (ref 0.0–0.1)
Basophils Relative: 2 %
Eosinophils Absolute: 0.1 10*3/uL (ref 0.0–0.7)
Eosinophils Relative: 2 %
HCT: 35.6 % — ABNORMAL LOW (ref 36.0–46.0)
Hemoglobin: 10.1 g/dL — ABNORMAL LOW (ref 12.0–15.0)
Lymphocytes Relative: 37 %
Lymphs Abs: 1.8 10*3/uL (ref 0.7–4.0)
MCH: 21.3 pg — ABNORMAL LOW (ref 26.0–34.0)
MCHC: 28.4 g/dL — ABNORMAL LOW (ref 30.0–36.0)
MCV: 74.9 fL — ABNORMAL LOW (ref 78.0–100.0)
Monocytes Absolute: 0.4 10*3/uL (ref 0.1–1.0)
Monocytes Relative: 8 %
Neutro Abs: 2.5 10*3/uL (ref 1.7–7.7)
Neutrophils Relative %: 51 %
Platelets: 586 10*3/uL — ABNORMAL HIGH (ref 150–400)
RBC: 4.75 MIL/uL (ref 3.87–5.11)
WBC: 4.9 10*3/uL (ref 4.0–10.5)

## 2015-12-14 LAB — SEDIMENTATION RATE: Sed Rate: 4 mm/hr (ref 0–22)

## 2015-12-14 LAB — OCCULT BLOOD X 1 CARD TO LAB, STOOL
Fecal Occult Bld: NEGATIVE
Fecal Occult Bld: NEGATIVE
Fecal Occult Bld: NEGATIVE

## 2015-12-14 MED ORDER — FERUMOXYTOL INJECTION 510 MG/17 ML
510.0000 mg | Freq: Once | INTRAVENOUS | Status: AC
  Administered 2015-12-14: 510 mg via INTRAVENOUS
  Filled 2015-12-14: qty 17

## 2015-12-14 MED ORDER — SODIUM CHLORIDE 0.9 % IV SOLN
Freq: Once | INTRAVENOUS | Status: AC
  Administered 2015-12-14: 09:00:00 via INTRAVENOUS

## 2015-12-14 NOTE — Progress Notes (Signed)
Faith Huff tolerated Iron infusion well today and discharged after being monitored for 30 minutes upon completion of therapy. NAD noted.

## 2015-12-15 LAB — INTRINSIC FACTOR ANTIBODIES: Intrinsic Factor: 1 AU/mL (ref 0.0–1.1)

## 2015-12-15 LAB — PATHOLOGIST SMEAR REVIEW

## 2015-12-15 LAB — ANTI-PARIETAL ANTIBODY: Parietal Cell Antibody-IgG: 14.1 Units (ref 0.0–20.0)

## 2015-12-15 LAB — HAPTOGLOBIN: Haptoglobin: 62 mg/dL (ref 34–200)

## 2015-12-21 ENCOUNTER — Encounter (HOSPITAL_COMMUNITY): Payer: BLUE CROSS/BLUE SHIELD | Attending: Hematology & Oncology

## 2015-12-21 VITALS — BP 109/62 | HR 79 | Temp 98.6°F | Resp 18

## 2015-12-21 DIAGNOSIS — D509 Iron deficiency anemia, unspecified: Secondary | ICD-10-CM

## 2015-12-21 DIAGNOSIS — N920 Excessive and frequent menstruation with regular cycle: Secondary | ICD-10-CM | POA: Insufficient documentation

## 2015-12-21 DIAGNOSIS — D649 Anemia, unspecified: Secondary | ICD-10-CM | POA: Insufficient documentation

## 2015-12-21 MED ORDER — SODIUM CHLORIDE 0.9 % IV SOLN
Freq: Once | INTRAVENOUS | Status: AC
  Administered 2015-12-21: 15:00:00 via INTRAVENOUS

## 2015-12-21 MED ORDER — SODIUM CHLORIDE 0.9 % IV SOLN
510.0000 mg | Freq: Once | INTRAVENOUS | Status: AC
  Administered 2015-12-21: 510 mg via INTRAVENOUS
  Filled 2015-12-21: qty 17

## 2015-12-21 NOTE — Progress Notes (Signed)
Faith Huff Tolerated iron infusion. Discharged ambulatory after 30 minute wait period

## 2015-12-21 NOTE — Telephone Encounter (Signed)
Called patient to follow up and schedule requested ultrasound. Patient has declined to schedule at this time, states she still needs to speak with her parents before scheduling. Patient states she will call me back once she had spoken with her parents. Routing to Dr Oscar LaJertson for review.

## 2015-12-21 NOTE — Patient Instructions (Signed)
[facility] Discharge Instructions  RECOMMENDATIONS MADE BY THE CONSULTANT AND ANY TEST RESULTS WILL BE SENT TO YOUR REFERRING PHYSICIAN.  IV iron today Follow up as scheduled Please call the clinic if you have any questions or concerns   Thank you for choosing [facility] at [facility] to provide your oncology and hematology care.  To afford each patient quality time with our provider, please arrive at least 15 minutes before your scheduled appointment time.   Beginning January 23rd 2017 lab work for the Cancer Center will be done in the  Main lab at [place] on 1st floor. If you have a lab appointment with the Cancer Center please come in thru the  Main Entrance and check in at the main information desk  You need to re-schedule your appointment should you arrive 10 or more minutes late.  We strive to give you quality time with our providers, and arriving late affects you and other patients whose appointments are after yours.  Also, if you no show three or more times for appointments you may be dismissed from the clinic at the providers discretion.     Again, thank you for choosing [facility].  Our hope is that these requests will decrease the amount of time that you wait before being seen by our physicians.       _____________________________________________________________  Should you have questions after your visit to [facility], please contact our office at [phone] between the hours of 8:30 a.m. and 4:30 p.m.  Voicemails left after 4:30 p.m. will not be returned until the following business day.  For prescription refill requests, have your pharmacy contact our office.         Resources For Cancer Patients and their Caregivers ? American Cancer Society: Can assist with transportation, wigs, general needs, runs Look Good Feel Better.        1-888-227-6333 ? Cancer Care: Provides  financial assistance, online support groups, medication/co-pay assistance.  1-800-813-HOPE (4673) ? [facility] Assists [place] cancer patients and their families through emotional , educational and financial support.  336-427-4357 ? [place] DSS Where to apply for food stamps, Medicaid and utility assistance. 336-342-1394 ? [facility]: Transportation to medical appointments. 336-347-2287 ? Social Security Administration: May apply for disability if have a Stage IV cancer. 336-342-7796 1-800-772-1213 ? [place] Aging, Disability and Transit Services: Assists with nutrition, care and transit needs. 336-349-2343  Cancer Center Support Programs: @10RELATIVEDAYS@ > Cancer Support Group  2nd Tuesday of the month 1pm-2pm, Journey Room  > Creative Journey  3rd Tuesday of the month 1130am-1pm, Journey Room  > Look Good Feel Better  1st Wednesday of the month 10am-12 noon, Journey Room (Call American Cancer Society to register 1-800-395-5775)    

## 2015-12-22 NOTE — Telephone Encounter (Signed)
Please call the patient and stress the importance of the ultrasound. This young lady has very heavy bleeding and a month ago had a Hgb of 7.5 gm/dl. This is not normal.

## 2016-01-09 ENCOUNTER — Telehealth: Payer: Self-pay | Admitting: *Deleted

## 2016-01-09 NOTE — Telephone Encounter (Signed)
See Next encounter for follow-up.

## 2016-01-09 NOTE — Telephone Encounter (Signed)
Follow-up call to patient regarding pelvic ultrasound. Voice mail is not set up, unable to leave message.

## 2016-01-19 ENCOUNTER — Ambulatory Visit (HOSPITAL_COMMUNITY): Payer: BLUE CROSS/BLUE SHIELD | Admitting: Hematology & Oncology

## 2016-01-19 ENCOUNTER — Other Ambulatory Visit (HOSPITAL_COMMUNITY): Payer: BLUE CROSS/BLUE SHIELD

## 2016-01-20 ENCOUNTER — Ambulatory Visit: Payer: BLUE CROSS/BLUE SHIELD | Admitting: Nurse Practitioner

## 2016-01-22 NOTE — Progress Notes (Signed)
This encounter was created in error - please disregard.

## 2016-01-23 ENCOUNTER — Ambulatory Visit (INDEPENDENT_AMBULATORY_CARE_PROVIDER_SITE_OTHER): Payer: BLUE CROSS/BLUE SHIELD | Admitting: Nurse Practitioner

## 2016-01-23 ENCOUNTER — Encounter: Payer: Self-pay | Admitting: Nurse Practitioner

## 2016-01-23 VITALS — BP 110/74 | Ht 64.0 in | Wt 155.5 lb

## 2016-01-23 DIAGNOSIS — D509 Iron deficiency anemia, unspecified: Secondary | ICD-10-CM | POA: Diagnosis not present

## 2016-01-23 DIAGNOSIS — D500 Iron deficiency anemia secondary to blood loss (chronic): Secondary | ICD-10-CM

## 2016-01-23 DIAGNOSIS — N920 Excessive and frequent menstruation with regular cycle: Secondary | ICD-10-CM

## 2016-01-23 LAB — POCT HEMOGLOBIN: Hemoglobin: 11.5 g/dL — AB (ref 12.2–16.2)

## 2016-01-24 ENCOUNTER — Encounter: Payer: Self-pay | Admitting: Nurse Practitioner

## 2016-01-24 DIAGNOSIS — D500 Iron deficiency anemia secondary to blood loss (chronic): Secondary | ICD-10-CM | POA: Insufficient documentation

## 2016-01-24 NOTE — Progress Notes (Signed)
Subjective:  Presents for recheck on her anemia. Had an iron infusion recently. Taking iron tablets 3 times per day. Has a very heavy menstrual cycle to the point where she has to wear a diaper. Occurs once a month. Denies any further pica.  Objective:   BP 110/74 mmHg  Ht 5\' 4"  (1.626 m)  Wt 155 lb 8 oz (70.534 kg)  BMI 26.68 kg/m2 NAD. Alert, oriented. Lungs clear. Heart regular rate rhythm. Results for orders placed or performed in visit on 01/23/16  POCT hemoglobin  Result Value Ref Range   Hemoglobin 11.5 (A) 12.2 - 16.2 g/dL   Much improved from a low of 5.5 on 5/15 and 10.1 on 5/31 after iron infusion Assessment:  Problem List Items Addressed This Visit      Other   Anemia, iron deficiency - Primary   Relevant Orders   POCT hemoglobin (Completed)   Iron deficiency anemia due to chronic blood loss   Menorrhagia     Plan: Decrease iron to one by mouth twice a day for now. Lengthy discussion regarding options about her heavy menstrual cycle. Patient defers any intervention at this time. Call back if she wishes to start some form of hormones for her cycle.

## 2016-01-26 NOTE — Telephone Encounter (Signed)
Follow-up call to patient. Voice mail is not set up, unable to leave message.

## 2016-02-17 ENCOUNTER — Encounter: Payer: Self-pay | Admitting: *Deleted

## 2016-02-17 NOTE — Telephone Encounter (Signed)
Multiple attempts to contact patient to schedule PUS. Patient has seen oncology for iron transfusion. Was seen by PCP on 01-23-16 and declined evaluation of abnormal menses at that time. (see notes in EPIC)  Letter to your desk for review.

## 2016-02-17 NOTE — Telephone Encounter (Signed)
Letter reviewed and signed by Dr Jertson. Encounter closed. 

## 2016-02-17 NOTE — Telephone Encounter (Signed)
Call to patient. Voice mail box is not set up. Unable to leave message.  

## 2016-03-08 ENCOUNTER — Ambulatory Visit (INDEPENDENT_AMBULATORY_CARE_PROVIDER_SITE_OTHER): Payer: BLUE CROSS/BLUE SHIELD | Admitting: Obstetrics and Gynecology

## 2016-03-08 ENCOUNTER — Encounter: Payer: Self-pay | Admitting: Obstetrics and Gynecology

## 2016-03-08 VITALS — BP 98/58 | HR 64 | Resp 12 | Wt 157.0 lb

## 2016-03-08 DIAGNOSIS — Z862 Personal history of diseases of the blood and blood-forming organs and certain disorders involving the immune mechanism: Secondary | ICD-10-CM | POA: Diagnosis not present

## 2016-03-08 DIAGNOSIS — N920 Excessive and frequent menstruation with regular cycle: Secondary | ICD-10-CM

## 2016-03-08 DIAGNOSIS — N946 Dysmenorrhea, unspecified: Secondary | ICD-10-CM | POA: Diagnosis not present

## 2016-03-08 LAB — CBC
HCT: 36.9 % (ref 35.0–45.0)
Hemoglobin: 11.9 g/dL (ref 11.7–15.5)
MCH: 27.9 pg (ref 27.0–33.0)
MCHC: 32.2 g/dL (ref 32.0–36.0)
MCV: 86.4 fL (ref 80.0–100.0)
MPV: 9.5 fL (ref 7.5–12.5)
Platelets: 274 10*3/uL (ref 140–400)
RBC: 4.27 MIL/uL (ref 3.80–5.10)
RDW: 13.7 % (ref 11.0–15.0)
WBC: 4.5 10*3/uL (ref 3.8–10.8)

## 2016-03-08 LAB — FERRITIN: Ferritin: 27 ng/mL (ref 10–154)

## 2016-03-08 MED ORDER — NORETHIN ACE-ETH ESTRAD-FE 1-20 MG-MCG PO TABS: 1.0000 | ORAL_TABLET | Freq: Every day | ORAL | 0 refills | Status: DC

## 2016-03-08 NOTE — Progress Notes (Signed)
GYNECOLOGY  VISIT   HPI: 20 y.o.   Single  African American  female   [id number] with Patient's last menstrual period was 02/07/2016.   here for follow up irregular menstrual cycles. She did not start the OCP. She has a h/o menorrhagia and severe anemia with a hgb of 7.5. She was seen in 5/17 and given a script for OCP's and a pelvic ultrasound was recommended. She forgot to start the pill, then decided not to. She thinks her last cycle was lighter than normal. She has anaprox for cramps which helps.  Menses q month x 5 days, on her heaviest day she is saturating a pad/hour. Last month she was saturating a pad in 2 hours.   GYNECOLOGIC HISTORY: Patient's last menstrual period was 02/07/2016. Contraception:none  Menopausal hormone therapy: none         OB History    Gravida Para Term Preterm AB Living   0 0 0 0 0 0   SAB TAB Ectopic Multiple Live Births   0 0 0 0           Patient Active Problem List   Diagnosis Date Noted  . Iron deficiency anemia due to chronic blood loss 01/24/2016  . 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 Anemia, iron deficiency 07/23/2013  . Menorrhagia 07/23/2013    Past Medical History:  Diagnosis Date  . Allergic rhinitis   . Anemia   . History of reactive airway disease     Past Surgical History:  Procedure Laterality Date  . MOUTH SURGERY    . WISDOM TOOTH EXTRACTION      Current Outpatient Prescriptions  Medication Sig Dispense Refill  . Iron TABS Take by mouth daily.    . norethindrone-ethinyl estradiol (JUNEL FE,GILDESS FE,LOESTRIN FE) 1-20 MG-MCG tablet Take 1 tablet by mouth daily. (Patient not taking: Reported on 01/23/2016) 3 Package 0   No current facility-administered medications for this visit.      ALLERGIES: Review of patient's allergies indicates no known allergies.  Family History  Problem Relation Age of Onset  . Anemia Mother     Social History   Social History  . Marital status: Single    Spouse name: N/A  . Number of  children: N/A  . Years of education: N/A   Occupational History  . Not on file.   Social History Main Topics  . Smoking status: Never Smoker  . Smokeless tobacco: Never Used  . Alcohol use No  . Drug use: No  . Sexual activity: No   Other Topics Concern  . Not on file   Social History Narrative  . No narrative on file    Review of Systems  Constitutional: Negative.   HENT: Negative.   Eyes: Negative.   Respiratory: Negative.   Cardiovascular: Negative.   Gastrointestinal: Negative.   Genitourinary:       Irregular menstrual cycles Heavy bleeding with cyles  Musculoskeletal: Negative.   Skin: Negative.   Neurological: Negative.   Endo/Heme/Allergies: Negative.   Psychiatric/Behavioral: Negative.     PHYSICAL EXAMINATION:    BP (!) 98/58 (BP Location: Right Arm, Patient Position: Sitting, Cuff Size: Normal)   Pulse 64   Resp 12   Wt 157 lb (71.2 kg)   LMP 02/07/2016   BMI 26.95 kg/m     General appearance: alert, cooperative and appears stated age  ASSESSMENT Menorrhagia, in the past she was on OCP's and didn't like how she felt, was bloated. Dysmenorrhea H/O severe anemia, requiring  iron transfusion    PLAN Willing to try OCP's, reviewed risks, no contraindications Start OCP's, f/u in 3 months, call with any concerns CBC, Ferritin She declines ultrasound for now, willing to consider if her anemia doesn't improve on OCP's   An After Visit Summary was printed and given to the patient.  20 minutes face to face time of which over 50% was spent in counseling.

## 2016-03-08 NOTE — Patient Instructions (Signed)

## 2016-06-11 ENCOUNTER — Ambulatory Visit: Payer: BLUE CROSS/BLUE SHIELD | Admitting: Obstetrics and Gynecology

## 2017-06-10 ENCOUNTER — Ambulatory Visit: Payer: BLUE CROSS/BLUE SHIELD | Admitting: Family Medicine

## 2018-05-14 ENCOUNTER — Ambulatory Visit: Payer: BLUE CROSS/BLUE SHIELD | Admitting: Family Medicine

## 2018-05-14 VITALS — BP 128/82 | Wt 155.0 lb

## 2018-05-14 DIAGNOSIS — N920 Excessive and frequent menstruation with regular cycle: Secondary | ICD-10-CM | POA: Diagnosis not present

## 2018-05-14 DIAGNOSIS — Z131 Encounter for screening for diabetes mellitus: Secondary | ICD-10-CM

## 2018-05-14 DIAGNOSIS — Z114 Encounter for screening for human immunodeficiency virus [HIV]: Secondary | ICD-10-CM

## 2018-05-14 DIAGNOSIS — Z113 Encounter for screening for infections with a predominantly sexual mode of transmission: Secondary | ICD-10-CM

## 2018-05-14 LAB — POCT URINE PREGNANCY: Preg Test, Ur: NEGATIVE

## 2018-05-14 MED ORDER — NORETHIN ACE-ETH ESTRAD-FE 1.5-30 MG-MCG PO TABS: 1.0000 | ORAL_TABLET | Freq: Every day | ORAL | 4 refills | Status: DC

## 2018-05-14 NOTE — Progress Notes (Addendum)
   Subjective:    Patient ID: Faith Huff, female    DOB: [birth year], 22 y.o.   MRN: [id number]  HPI  Patient arrives to discuss birth control. Patient got her first depo provera [facility] and states the first month she had no bleeding but then passed a large clump of flesh she believes was a "residual cast" and bled for several weeks afterward but now has stopped bleeding.  Had first depo shot in late August. Hx of very heavy menstrual cycles, regular monthly, menses x 5 days. Once on depo did not have a period for 4 weeks, and then she started bleeding like her normal period for about 4 days, then had some heavier bleeding for about 2 weeks that gradually got lighter. Stopped bleeding about 5 days ago. Took home pregnancy test - negative. Hx of sexual activity - multiple partners, no condom use. Has never been screened for STDs.   Started on depo for pregnancy prevention and to help control cycles.   Reports some light-headedness with bleeding. Has significant hx of severe anemia with heavy cycles. Was being followed by GYN and oncology for iron transfusions, last seen in 2017, has not followed-up.  Past Medical History:  Diagnosis Date  . Allergic rhinitis   . Anemia   . History of reactive airway disease      Review of Systems  Constitutional: Negative for fatigue.  Respiratory: Negative for shortness of breath.   Cardiovascular: Negative for chest pain and palpitations.  Genitourinary: Positive for menstrual problem. Negative for pelvic pain, vaginal bleeding, vaginal discharge and vaginal pain.  Neurological: Positive for light-headedness. Negative for syncope.      Objective:   Physical Exam  Constitutional: She is oriented to person, place, and time. She appears well-developed and well-nourished. No distress.  HENT:  Head: Normocephalic and atraumatic.  Neck: Neck supple.  Cardiovascular: Normal rate, regular rhythm and  normal heart sounds.  No murmur heard. Pulmonary/Chest: Effort normal and breath sounds normal. No respiratory distress.  Abdominal: Soft. There is no tenderness.  Lymphadenopathy:    She has no cervical adenopathy.  Neurological: She is alert and oriented to person, place, and time.  Skin: Skin is warm and dry.  Psychiatric: She has a normal mood and affect.  Nursing note and vitals reviewed.     Assessment & Plan:  Menorrhagia with regular cycle Will get lab work today: CBC, ferritin, Met 7, Liver, HIV, urine pregnancy, urine STD testing; will notify patient of results. Will start patient on COC - discussed proper use and need to use back up method of birth control for the first month. Safe sex practices discussed in detail.  Encouraged f/u in 1 month for routine wellness, as patient has not been seen in over 2 years.   Dr. Mickie Hillier was consulted on this case and is in agreement with the above treatment plan.  25 minutes was spent with the patient.  This statement verifies that 25 minutes was indeed spent with the patient.  More than 50% of this visit-total duration of the visit-was spent in counseling and coordination of care. The issues that the patient came in for today as reflected in the diagnosis (s) please refer to documentation for further details.

## 2018-05-14 NOTE — Patient Instructions (Signed)

## 2018-05-15 LAB — CBC WITH DIFFERENTIAL/PLATELET
Basophils Absolute: 0.1 10*3/uL (ref 0.0–0.2)
Basos: 2 %
EOS (ABSOLUTE): 0.2 10*3/uL (ref 0.0–0.4)
Eos: 3 %
Hematocrit: 35.3 % (ref 34.0–46.6)
Hemoglobin: 10.5 g/dL — ABNORMAL LOW (ref 11.1–15.9)
Immature Grans (Abs): 0 10*3/uL (ref 0.0–0.1)
Immature Granulocytes: 0 %
Lymphocytes Absolute: 2.3 10*3/uL (ref 0.7–3.1)
Lymphs: 47 %
MCH: 21.9 pg — ABNORMAL LOW (ref 26.6–33.0)
MCHC: 29.7 g/dL — ABNORMAL LOW (ref 31.5–35.7)
MCV: 74 fL — ABNORMAL LOW (ref 79–97)
Monocytes Absolute: 0.6 10*3/uL (ref 0.1–0.9)
Monocytes: 12 %
Neutrophils Absolute: 1.8 10*3/uL (ref 1.4–7.0)
Neutrophils: 36 %
Platelets: 420 10*3/uL (ref 150–450)
RBC: 4.8 x10E6/uL (ref 3.77–5.28)
RDW: 15.3 % (ref 12.3–15.4)
WBC: 5 10*3/uL (ref 3.4–10.8)

## 2018-05-15 LAB — HIV ANTIBODY (ROUTINE TESTING W REFLEX): HIV Screen 4th Generation wRfx: NONREACTIVE

## 2018-05-15 LAB — HEPATIC FUNCTION PANEL
ALT: 9 IU/L (ref 0–32)
AST: 13 IU/L (ref 0–40)
Albumin: 4.5 g/dL (ref 3.5–5.5)
Alkaline Phosphatase: 56 IU/L (ref 39–117)
Bilirubin Total: <0.2 mg/dL (ref 0.0–1.2)
Bilirubin, Direct: 0.07 mg/dL (ref 0.00–0.40)
Total Protein: 7.3 g/dL (ref 6.0–8.5)

## 2018-05-15 LAB — BASIC METABOLIC PANEL
BUN/Creatinine Ratio: 21 (ref 9–23)
BUN: 12 mg/dL (ref 6–20)
CO2: 22 mmol/L (ref 20–29)
Calcium: 9.3 mg/dL (ref 8.7–10.2)
Chloride: 107 mmol/L — ABNORMAL HIGH (ref 96–106)
Creatinine, Ser: 0.58 mg/dL (ref 0.57–1.00)
GFR calc Af Amer: 149 mL/min/{1.73_m2} (ref >59)
GFR calc non Af Amer: 129 mL/min/{1.73_m2} (ref >59)
Glucose: 83 mg/dL (ref 65–99)
Potassium: 4.2 mmol/L (ref 3.5–5.2)
Sodium: 141 mmol/L (ref 134–144)

## 2018-05-15 LAB — FERRITIN: Ferritin: 4 ng/mL — ABNORMAL LOW (ref 15–150)

## 2018-05-15 NOTE — Addendum Note (Signed)
Addended by: Jeannine Boga D on: 05/15/2018 04:26 PM   Modules accepted: Level of Service

## 2018-05-16 LAB — SPECIMEN STATUS REPORT

## 2018-05-16 LAB — CHLAMYDIA/GONOCOCCUS/TRICHOMONAS, NAA
Chlamydia by NAA: NEGATIVE
Gonococcus by NAA: NEGATIVE
Trich vag by NAA: NEGATIVE

## 2018-05-23 ENCOUNTER — Encounter: Payer: BLUE CROSS/BLUE SHIELD | Admitting: Family Medicine

## 2018-05-29 ENCOUNTER — Other Ambulatory Visit: Payer: Self-pay | Admitting: *Deleted

## 2018-05-29 DIAGNOSIS — R790 Abnormal level of blood mineral: Secondary | ICD-10-CM

## 2018-05-29 DIAGNOSIS — D649 Anemia, unspecified: Secondary | ICD-10-CM

## 2018-06-06 ENCOUNTER — Encounter: Payer: Self-pay | Admitting: Family Medicine

## 2018-06-06 ENCOUNTER — Ambulatory Visit (INDEPENDENT_AMBULATORY_CARE_PROVIDER_SITE_OTHER): Payer: BLUE CROSS/BLUE SHIELD | Admitting: Family Medicine

## 2018-06-06 VITALS — BP 110/78 | Ht 64.0 in | Wt 153.0 lb

## 2018-06-06 DIAGNOSIS — Z0001 Encounter for general adult medical examination with abnormal findings: Secondary | ICD-10-CM

## 2018-06-06 DIAGNOSIS — N63 Unspecified lump in unspecified breast: Secondary | ICD-10-CM

## 2018-06-06 NOTE — Progress Notes (Signed)
Subjective:    Patient ID: Faith Huff, female    DOB: 12-Jan-1996, 22 y.o.   MRN: [id number]  HPI  The patient comes in today for a wellness visit.  A review of their health history was completed.  A review of medications was also completed.  Any needed refills; No  Eating habits:good  Falls/  MVA accidents in past few months: No  Regular exercise:Some, some aerobics, and moving at work a Ecologist pt sees on regular basis:No  Preventative health issues were discussed.   Additional concerns: Stopped the birthcontrol after 4 days. It made her right breast feel heavy and uncomfortable.   LMP: 05/30/18, first menses since being off depo. Not as heavy as usual. Will consider other forms of birth control. Does not want to try a different OCP at this time. Denies current sexual activity.  Thinks she had tetanus vaccine in 2015. Declines flu vaccine.   Pt reports "cyst" to right breast under right nipple since she first began birth control pills several years ago, states feels like that area gets bigger and heavier when she is on birth control pills. States she has never had this area looked at before.  Review of Systems  Constitutional: Negative for chills, fatigue, fever and unexpected weight change.  HENT: Negative for congestion, ear pain, sinus pressure, sinus pain and sore throat.   Eyes: Negative for discharge and visual disturbance.  Respiratory: Negative for cough, shortness of breath and wheezing.   Cardiovascular: Negative for chest pain and leg swelling.  Gastrointestinal: Negative for abdominal pain, blood in stool, constipation, diarrhea, nausea and vomiting.  Genitourinary: Negative for difficulty urinating, hematuria, pelvic pain and vaginal discharge.  Neurological: Negative for dizziness, weakness, light-headedness and headaches.  Psychiatric/Behavioral: Negative for suicidal ideas.  All other systems reviewed and are negative.      Objective:     Physical Exam  Constitutional: She is oriented to person, place, and time. She appears well-developed and well-nourished. No distress.  HENT:  Head: Normocephalic and atraumatic.  Mouth/Throat: Oropharynx is clear and moist.  Eyes: Pupils are equal, round, and reactive to light. Conjunctivae and EOM are normal. Right eye exhibits no discharge. Left eye exhibits no discharge.  Neck: Neck supple. No thyromegaly present.  Cardiovascular: Normal rate, regular rhythm and normal heart sounds.  No murmur heard. Pulmonary/Chest: Effort normal and breath sounds normal. No respiratory distress. She has no wheezes. Right breast exhibits mass (small, pea sized nodue palpated at about 6-7 o'clock underneath right nipple, non-tender, mobile). Right breast exhibits no inverted nipple, no nipple discharge, no skin change and no tenderness. Left breast exhibits no inverted nipple, no mass, no nipple discharge, no skin change and no tenderness.  Abdominal: Soft. Bowel sounds are normal. She exhibits no distension and no mass. There is no tenderness.  Genitourinary: Vagina normal and uterus normal. There is no rash, tenderness, lesion or injury on the right labia. There is no rash, tenderness, lesion or injury on the left labia. Cervix exhibits no motion tenderness. Right adnexum displays no mass and no tenderness. Left adnexum displays no mass and no tenderness.  Musculoskeletal: She exhibits no edema or deformity.  Lymphadenopathy:    She has no cervical adenopathy.  Neurological: She is alert and oriented to person, place, and time. Coordination normal.  Skin: Skin is warm and dry.  Psychiatric: She has a normal mood and affect.  Nursing note and vitals reviewed.      Assessment & Plan:  1. Encounter for well adult exam with abnormal findings Adult wellness-complete.wellness physical was conducted today. Importance of diet and exercise were discussed in detail.  In addition to this a discussion regarding  safety was also covered. We also reviewed over immunizations and gave recommendations regarding current immunization needed for age.   -Declines flu vaccine In addition to this additional areas were also touched on including: Preventative health exams needed:  Pap Smear due in 2020  Patient was advised yearly wellness exam.  Birth control options discussed, pt would like to think about IUD, information given. Safe sex practices discussed.   Encouraged to get repeat CBC and ferritin levels done in the next month or so.   2. Breast lump Discussed importance of getting this area evaluated by a specialist to determine best mode of imaging, will [place] to general surgery [place].  Dr. Lubertha SouthSteve Luking was consulted on this case and is in agreement with the above treatment plan.

## 2018-06-06 NOTE — Patient Instructions (Addendum)
Ferrous sulfate 325 mg daily   Intrauterine Device Information An intrauterine device (IUD) is inserted into your uterus to prevent pregnancy. There are two types of IUDs available:  Copper IUD-This type of IUD is wrapped in copper wire and is placed inside the uterus. Copper makes the uterus and fallopian tubes produce a fluid that kills sperm. The copper IUD can stay in place for 10 years.  Hormone IUD-This type of IUD contains the hormone progestin (synthetic progesterone). The hormone thickens the cervical mucus and prevents sperm from entering the uterus. It also thins the uterine lining to prevent implantation of a fertilized egg. The hormone can weaken or kill the sperm that get into the uterus. One type of hormone IUD can stay in place for 5 years, and another type can stay in place for 3 years.  Your health care provider will make sure you are a good candidate for a contraceptive IUD. Discuss with your health care provider the possible side effects. Advantages of an intrauterine device  IUDs are highly effective, reversible, long acting, and low maintenance.  There are no estrogen-related side effects.  An IUD can be used when breastfeeding.  IUDs are not associated with weight gain.  The copper IUD works immediately after insertion.  The hormone IUD works right away if inserted within 7 days of your period starting. You will need to use a backup method of birth control for 7 days if the hormone IUD is inserted at any other time in your cycle.  The copper IUD does not interfere with your female hormones.  The hormone IUD can make heavy menstrual periods lighter and decrease cramping.  The hormone IUD can be used for 3 or 5 years.  The copper IUD can be used for 10 years. Disadvantages of an intrauterine device  The hormone IUD can be associated with irregular bleeding patterns.  The copper IUD can make your menstrual flow heavier and more painful.  You may experience  cramping and vaginal bleeding after insertion. This information is not intended to replace advice given to you by your health care provider. Make sure you discuss any questions you have with your health care provider. Document Released: 06/05/2004 Document Revised: 12/08/2015 Document Reviewed: 12/21/2012 Elsevier Interactive Patient Education  2017 [employer]

## 2018-06-19 ENCOUNTER — Encounter: Payer: Self-pay | Admitting: Family Medicine

## 2018-06-29 ENCOUNTER — Ambulatory Visit (HOSPITAL_COMMUNITY)
Admission: EM | Admit: 2018-06-29 | Discharge: 2018-06-29 | Disposition: A | Discharge status: Home / Self Care | Payer: BLUE CROSS/BLUE SHIELD | Attending: Family | Admitting: Family

## 2018-06-29 ENCOUNTER — Encounter (HOSPITAL_COMMUNITY): Payer: Self-pay

## 2018-06-29 ENCOUNTER — Other Ambulatory Visit: Payer: Self-pay

## 2018-06-29 DIAGNOSIS — B084 Enteroviral vesicular stomatitis with exanthem: Secondary | ICD-10-CM

## 2018-06-29 MED ORDER — PREDNISONE 10 MG (21) PO TBPK: ORAL_TABLET | ORAL | 0 refills | Status: DC

## 2018-06-29 NOTE — ED Triage Notes (Signed)
Pt presents today with rash on her face, hands, and feet that itches. States she did have a fever yesterday but not today. Was around her nieces that were diagnosed with hand, foot, and mouth.

## 2018-06-29 NOTE — ED Provider Notes (Signed)
[facility]    CSN: [id number] Arrival date & time: 06/29/18  1731     History   Chief Complaint Chief Complaint  Patient presents with  . Rash    HPI Maryla MorrowGloriah H Moncivais is a 22 y.o. female.   22 year old female presents with fever and rash on her hands, feet and face. Started with a fever 3 days ago. Resolved yesterday but then broke out in a rash on her hands yesterday and now has spread to the bottom of her feet, her face and a few lesions in her mouth today. Itchy. Denies any nasal congestion, cough or GI symptoms. She took one dose of Amoxicillin yesterday in case an antibiotic would be helpful. She has been around her nieces who were recently dx with hand, foot and mouth disease. Otherwise no other chronic health issues except anemia and takes Iron supplements daily.   The history is provided by the patient.    Past Medical History:  Diagnosis Date  . Allergic rhinitis   . Anemia   . History of reactive airway disease     Patient Active Problem List   Diagnosis Date Noted  . Iron deficiency anemia due to chronic blood loss 01/24/2016  . 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 Anemia, iron deficiency 07/23/2013  . Menorrhagia 07/23/2013    Past Surgical History:  Procedure Laterality Date  . MOUTH SURGERY    . WISDOM TOOTH EXTRACTION      OB History    Gravida  0   Para  0   Term  0   Preterm  0   AB  0   Living  0     SAB  0   TAB  0   Ectopic  0   Multiple  0   Live Births               Home Medications    Prior to Admission medications   Medication Sig Start Date End Date Taking? Authorizing Provider  Iron TABS Take by mouth daily.   Yes [provider]  predniSONE (STERAPRED UNI-PAK 21 TAB) 10 MG (21) TBPK tablet Take 6 tabs by mouth daily today then decrease by 1 tablet each day until finished on day 6. 06/29/18   Amyot, Ali LoweAnn Berry, NP    Family History Family History  Problem Relation Age of Onset  . Anemia  Mother     Social History Social History   Tobacco Use  . Smoking status: Never Smoker  . Smokeless tobacco: Never Used  Substance Use Topics  . Alcohol use: No    Alcohol/week: 0.0 standard drinks  . Drug use: No     Allergies   Patient has no known allergies.   Review of Systems Review of Systems  Constitutional: Positive for fatigue and fever. Negative for activity change, appetite change and chills.  HENT: Positive for mouth sores. Negative for congestion, ear discharge, ear pain, facial swelling, postnasal drip, rhinorrhea, sinus pressure, sinus pain, sneezing, sore throat and trouble swallowing.   Eyes: Negative for pain, discharge, redness and itching.  Respiratory: Negative for cough, chest tightness, shortness of breath and wheezing.   Gastrointestinal: Negative for abdominal pain, nausea and vomiting.  Genitourinary: Negative for decreased urine volume, difficulty urinating, dysuria, flank pain and hematuria.  Musculoskeletal: Negative for arthralgias, back pain, myalgias, neck pain and neck stiffness.  Skin: Positive for rash. Negative for wound.  Neurological: Negative for dizziness, tremors, seizures, syncope, weakness, light-headedness,  numbness and headaches.  Hematological: Negative for adenopathy. Does not bruise/bleed easily.     Physical Exam Triage Vital Signs ED Triage Vitals  Enc Vitals Group     BP 06/29/18 1752 113/77     Pulse Rate 06/29/18 1752 90     Resp 06/29/18 1752 16     Temp 06/29/18 1752 98.4 F (36.9 C)     Temp Source 06/29/18 1752 Oral     SpO2 06/29/18 1752 100 %     Weight --      Height --      Head Circumference --      Peak Flow --      Pain Score 06/29/18 1753 3     Pain Loc --      Pain Edu? --      Excl. in GC? --    No data found.  Updated Vital Signs BP 113/77 (BP Location: Right Arm)   Pulse 90   Temp 98.4 F (36.9 C) (Oral)   Resp 16   SpO2 100%   Visual Acuity Right Eye Distance:   Left Eye Distance:    Bilateral Distance:    Right Eye Near:   Left Eye Near:    Bilateral Near:     Physical Exam Vitals signs and nursing note reviewed.  Constitutional:      General: She is not in acute distress.    Appearance: Normal appearance. She is well-developed, well-groomed and normal weight. She is not ill-appearing.     Comments: Patient sitting comfortably on exam table but continues to move hands and fingers to try to avoid scratching.   HENT:     Head: Normocephalic and atraumatic. No raccoon eyes.      Right Ear: Hearing, tympanic membrane, ear canal and external ear normal.     Left Ear: Hearing, tympanic membrane, ear canal and external ear normal.     Nose: Nose normal.     Mouth/Throat:     Mouth: Mucous membranes are moist. Oral lesions present.     Palate: Lesions present.     Pharynx: Uvula midline. Posterior oropharyngeal erythema present. No pharyngeal swelling or oropharyngeal exudate.     Tonsils: No tonsillar exudate.     Comments: A few pink scattered lesions present on posterior pharynx and upper palate.  Eyes:     Extraocular Movements: Extraocular movements intact.     Conjunctiva/sclera: Conjunctivae normal.  Neck:     Musculoskeletal: Normal range of motion and neck supple.  Cardiovascular:     Rate and Rhythm: Normal rate.  Pulmonary:     Effort: Pulmonary effort is normal.  Musculoskeletal: Normal range of motion.       Hands:       Feet:     Comments: Pink papular rash/lesions present on mainly palms bilaterally with a few scattered lesions on dorsal aspect of hands. A few lesions at base of fingers. Similar lesions present on soles of feet bilaterally. Mostly near heel. No crusting or discharge. No surrounding erythema. Appears itchy. No warmth. No neuro deficits noted.   Lymphadenopathy:     Cervical: No cervical adenopathy.  Skin:    General: Skin is warm and dry.     Capillary Refill: Capillary refill takes less than 2 seconds.     Findings: Rash  present. No ecchymosis, erythema or petechiae. Rash is papular. Rash is not crusting, purpuric, scaling or vesicular.  Neurological:     General: No focal deficit present.  Mental Status: She is alert and oriented to person, place, and time.  Psychiatric:        Mood and Affect: Mood normal.        Behavior: Behavior normal. Behavior is cooperative.        Thought Content: Thought content normal.        Judgment: Judgment normal.      UC Treatments / Results  Labs (all labs ordered are listed, but only abnormal results are displayed) Labs Reviewed - No data to display  EKG None  Radiology No results found.  Procedures Procedures (including critical care time)  Medications Ordered in UC Medications - No data to display  Initial Impression / Assessment and Plan / UC Course  I have reviewed the triage vital signs and the nursing notes.  Pertinent labs & imaging results that were available during my care of the patient were reviewed by me and considered in my medical decision making (see chart for details).    Discussed with patient that she probably has hand, foot and mouth disease. Reviewed that this is a viral illness and antibiotics are not helpful. Do not take any more Amoxicillin. Reviewed normal course of illness and contagious nature. Discussed that she can not pass this illness to others through rash on hands. Patient very uncomfortable due to itching. Discussed comfort measures such as cool showers and compresses to help. May also try Benadryl- orally and cream as directed. Since patient is uncomfortable and requests additional medication for symptoms- will trial Prednisone 10mg  6 day dose pack as directed- discussed that this medication will probably not alter course of illness. Note written for work. Follow-up in 3 to 4 days if not improving.  Final Clinical Impressions(s) / UC Diagnoses   Final diagnoses:  Hand, foot and mouth disease     Discharge Instructions      Recommend start Prednisone 10mg  tablets- take 6 tablets today and then decrease by 1 tablet each day until finished on day 6. May use OTC Benadryl cream to rash for comfort to help with itching. Apply cool compresses to affected area. Follow-up in 3 to 4 days if not improving.     ED Prescriptions    Medication Sig Dispense Auth. Provider   predniSONE (STERAPRED UNI-PAK 21 TAB) 10 MG (21) TBPK tablet Take 6 tabs by mouth daily today then decrease by 1 tablet each day until finished on day 6. 21 tablet Amyot, Ali Lowe, NP     Controlled Substance Prescriptions [place] Controlled Substance Registry consulted? Not Applicable   Sudie Grumbling, NP 06/30/18 1352

## 2018-06-29 NOTE — Discharge Instructions (Addendum)
Recommend start Prednisone 10mg  tablets- take 6 tablets today and then decrease by 1 tablet each day until finished on day 6. May use OTC Benadryl cream to rash for comfort to help with itching. Apply cool compresses to affected area. Follow-up in 3 to 4 days if not improving.

## 2019-04-13 ENCOUNTER — Other Ambulatory Visit (HOSPITAL_COMMUNITY)
Admission: RE | Admit: 2019-04-13 | Discharge: 2019-04-13 | Disposition: A | Discharge status: Home / Self Care | Payer: BC Managed Care – PPO | Source: Ambulatory Visit | Attending: Obstetrics and Gynecology | Admitting: Obstetrics and Gynecology

## 2019-04-13 ENCOUNTER — Telehealth: Payer: Self-pay | Admitting: *Deleted

## 2019-04-13 ENCOUNTER — Other Ambulatory Visit: Payer: Self-pay

## 2019-04-13 ENCOUNTER — Encounter: Payer: Self-pay | Admitting: Obstetrics and Gynecology

## 2019-04-13 ENCOUNTER — Ambulatory Visit: Payer: BC Managed Care – PPO | Admitting: Obstetrics and Gynecology

## 2019-04-13 VITALS — BP 118/70 | HR 92 | Temp 97.1°F | Resp 12 | Ht 65.0 in | Wt 146.6 lb

## 2019-04-13 DIAGNOSIS — Z3042 Encounter for surveillance of injectable contraceptive: Secondary | ICD-10-CM

## 2019-04-13 DIAGNOSIS — Z01419 Encounter for gynecological examination (general) (routine) without abnormal findings: Secondary | ICD-10-CM | POA: Diagnosis not present

## 2019-04-13 DIAGNOSIS — N920 Excessive and frequent menstruation with regular cycle: Secondary | ICD-10-CM

## 2019-04-13 DIAGNOSIS — Z113 Encounter for screening for infections with a predominantly sexual mode of transmission: Secondary | ICD-10-CM | POA: Insufficient documentation

## 2019-04-13 DIAGNOSIS — Z862 Personal history of diseases of the blood and blood-forming organs and certain disorders involving the immune mechanism: Secondary | ICD-10-CM

## 2019-04-13 DIAGNOSIS — Z124 Encounter for screening for malignant neoplasm of cervix: Secondary | ICD-10-CM | POA: Diagnosis present

## 2019-04-13 DIAGNOSIS — Z3009 Encounter for other general counseling and advice on contraception: Secondary | ICD-10-CM | POA: Diagnosis not present

## 2019-04-13 DIAGNOSIS — Z0000 Encounter for general adult medical examination without abnormal findings: Secondary | ICD-10-CM

## 2019-04-13 DIAGNOSIS — N6313 Unspecified lump in the right breast, lower outer quadrant: Secondary | ICD-10-CM

## 2019-04-13 LAB — POCT URINE PREGNANCY: Preg Test, Ur: NEGATIVE

## 2019-04-13 MED ORDER — MEDROXYPROGESTERONE ACETATE 150 MG/ML IM SUSP
150.0000 mg | Freq: Once | INTRAMUSCULAR | Status: AC
  Administered 2019-04-13: 150 mg via INTRAMUSCULAR

## 2019-04-13 NOTE — Telephone Encounter (Signed)
Order placed for right breast US at [facility].

## 2019-04-13 NOTE — Progress Notes (Signed)
23 y.o. [place] or African American Not Hispanic or Latino female here as an old-new patient for heavy cycles and to discuss birth control. Patient is due for an annual exam.   Period Cycle (Days): 28 Period Duration (Days): 5 Period Pattern: Regular Menstrual Flow: Heavy, Moderate Menstrual Control: Other (Comment), Maxi pad(depends) Menstrual Control Change Freq (Hours): 2 Dysmenorrhea: (!) Moderate Dysmenorrhea Symptoms: Cramping, Nausea  Her cycles have gotten a little lighter and less crampy in the last year. She needs to wear a very large pad on a diaper, she bleeds over in about 2 hours. That has improved from bleeding through in 1 hours. Cramps aren't as bad as they used to be.   She has a h/o severe anemia from menorrhagia and was previously treated with OCPs and depo-provera. She only tried the depo x 1. Felt okay on it. She would like to try the shot. She didn't like how she felt on the pill, only took it for a month. Her breast swell on the pill and she didn't like it. Not currently sexually active, has been sexually active with men. Didn't always use condoms. No dyspareunia.   She feels a lump in her right breast, no change in years, not tender.   Patient's last menstrual period was 03/23/2019.          Sexually active: Yes.    The current method of family planning is none.    Exercising: Yes.    cardio and stretching Smoker:  no  Health Maintenance: Pap:  12/07/15 Normal History of abnormal Pap:  no MMG:  n/a BMD:   n/a Colonoscopy: n/a TDaP:  2017 Gardasil: yes, completed series   reports that she has never smoked. She has never used smokeless tobacco. She reports current alcohol use. She reports that she does not use drugs. Very rare ETOH. 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   Past Medical History:  Diagnosis Date  . Allergic rhinitis   . Anemia   . History of reactive airway  disease     Past Surgical History:  Procedure Laterality Date  . LASIK    . MOUTH SURGERY    . WISDOM TOOTH EXTRACTION      No current outpatient medications on file.   No current facility-administered medications for this visit.     Family History  Problem Relation Age of Onset  . Anemia Mother   . Diabetes Father   . Hypertension Father     Review of Systems  Constitutional: Negative.   HENT: Negative.   Eyes: Negative.   Respiratory: Negative.   Cardiovascular: Negative.   Gastrointestinal: Negative.   Endocrine: Negative.   Genitourinary: Negative.   Musculoskeletal: Negative.   Skin: Negative.   Allergic/Immunologic: Negative.   Neurological: Negative.   Hematological: Negative.   Psychiatric/Behavioral: Negative.     Exam:   BP 118/70 (BP Location: Right Arm, Patient Position: Sitting, Cuff Size: Normal)   Pulse 92   Temp (!) 97.1 F (36.2 C) (Temporal)   Resp 12   Ht 5\' 5"  (1.651 m)   Wt 146 lb 9.6 oz (66.5 kg)   LMP 03/23/2019   BMI 24.40 kg/m   Weight change: @WEIGHTCHANGE @ Height:   Height: 5\' 5"  (165.1 cm)  Ht Readings from Last 3 Encounters:  04/13/19 5\' 5"  (1.651 m)  06/06/18 5\' 4"  (1.626 m)  01/23/16 5\' 4"  (1.626 m)    General appearance:  alert, cooperative and appears stated age Head: Normocephalic, without obvious abnormality, atraumatic Neck: no adenopathy, supple, symmetrical, trachea midline and thyroid normal to inspection and palpation Lungs: clear to auscultation bilaterally Cardiovascular: regular rate and rhythm Breasts: in the right breast at 7 o'clock is a pea sized lump just outside the areolar region. No other lumps, no skin changes, not tender.  Abdomen: soft, non-tender; non distended,  no masses,  no organomegaly Extremities: extremities normal, atraumatic, no cyanosis or edema Skin: Skin color, texture, turgor normal. No rashes or lesions Lymph nodes: Cervical, supraclavicular, and axillary nodes normal. No abnormal  inguinal nodes palpated Neurologic: Grossly normal   Pelvic: External genitalia:  no lesions              Urethra:  normal appearing urethra with no masses, tenderness or lesions              Bartholins and Skenes: normal                 Vagina: normal appearing vagina with normal color and discharge, no lesions              Cervix: no lesions, friable with pap               Bimanual Exam:  Uterus:  normal size, contour, position, consistency, mobility, non-tender, anteverted              Adnexa: no mass, fullness, tenderness               Rectovaginal: Confirms               Anus:  normal sphincter tone, no lesions  Chaperone was present for exam.  A:  Well Woman with normal exam  H/O menorrhagia leading to anemia. Cycles are lighter than they were, but still heavy. Normal exam   P:   Pap  STD testing  Screening labs  Not sexually active x 5 weeks, check UPT if negative, start depo-provera today  Depo-provera, 150 mg IM q 12 weeks   Discussed breast self exam  Discussed calcium and vit D intake

## 2019-04-13 NOTE — Patient Instructions (Signed)
EXERCISE AND DIET:  We recommended that you start or continue a regular exercise program for good health. Regular exercise means any activity that makes your heart beat faster and makes you sweat.  We recommend exercising at least 30 minutes per day at least 3 days a week, preferably 4 or 5.  We also recommend a diet low in fat and sugar.  Inactivity, poor dietary choices and obesity can cause diabetes, heart attack, stroke, and kidney damage, among others.    ALCOHOL AND SMOKING:  Women should limit their alcohol intake to no more than 7 drinks/beers/glasses of wine (combined, not each!) per week. Moderation of alcohol intake to this level decreases your risk of breast cancer and liver damage. And of course, no recreational drugs are part of a healthy lifestyle.  And absolutely no smoking or even second hand smoke. Most people know smoking can cause heart and lung diseases, but did you know it also contributes to weakening of your bones? Aging of your skin?  Yellowing of your teeth and nails?  CALCIUM AND VITAMIN D:  Adequate intake of calcium and Vitamin D are recommended.  The recommendations for exact amounts of these supplements seem to change often, but generally speaking 1,000 mg of calcium (between diet and supplement) and 800 units of Vitamin D per day seems prudent. Certain women may benefit from higher intake of Vitamin D.  If you are among these women, your doctor will have told you during your visit.    PAP SMEARS:  Pap smears, to check for cervical cancer or precancers,  have traditionally been done yearly, although recent scientific advances have shown that most women can have pap smears less often.  However, every woman still should have a physical exam from her gynecologist every year. It will include a breast check, inspection of the vulva and vagina to check for abnormal growths or skin changes, a visual exam of the cervix, and then an exam to evaluate the size and shape of the uterus and  ovaries.  And after 25 years of age, a rectal exam is indicated to check for rectal cancers. We will also provide age appropriate advice regarding health maintenance, like when you should have certain vaccines, screening for sexually transmitted diseases, bone density testing, colonoscopy, mammograms, etc.   MAMMOGRAMS:  All women over 75 years old should have a yearly mammogram. Many facilities now offer a "3D" mammogram, which may cost around $50 extra out of pocket. If possible,  we recommend you accept the option to have the 3D mammogram performed.  It both reduces the number of women who will be called back for extra views which then turn out to be normal, and it is better than the routine mammogram at detecting truly abnormal areas.    COLON CANCER SCREENING: Now recommend starting at age 57. At this time colonoscopy is not covered for routine screening until 50. There are take home tests that can be done between 45-49.   COLONOSCOPY:  Colonoscopy to screen for colon cancer is recommended for all women at age 91.  We know, you hate the idea of the prep.  We agree, BUT, having colon cancer and not knowing it is worse!!  Colon cancer so often starts as a polyp that can be seen and removed at colonscopy, which can quite literally save your life!  And if your first colonoscopy is normal and you have no family history of colon cancer, most women don't have to have it again for  10 years.  Once every ten years, you can do something that may end up saving your life, right?  We will be happy to help you get it scheduled when you are ready.  Be sure to check your insurance coverage so you understand how much it will cost.  It may be covered as a preventative service at no cost, but you should check your particular policy.   ° ° ° °Breast Self-Awareness °Breast self-awareness means being familiar with how your breasts look and feel. It involves checking your breasts regularly and reporting any changes to your  health care provider. °Practicing breast self-awareness is important. A change in your breasts can be a sign of a serious medical problem. Being familiar with how your breasts look and feel allows you to find any problems early, when treatment is more likely to be successful. All women should practice breast self-awareness, including women who have had breast implants. °How to do a breast self-exam °One way to learn what is normal for your breasts and whether your breasts are changing is to do a breast self-exam. To do a breast self-exam: °Look for Changes ° °1. Remove all the clothing above your waist. °2. Stand in front of a mirror in a room with good lighting. °3. Put your hands on your hips. °4. Push your hands firmly downward. °5. Compare your breasts in the mirror. Look for differences between them (asymmetry), such as: °? Differences in shape. °? Differences in size. °? Puckers, dips, and bumps in one breast and not the other. °6. Look at each breast for changes in your skin, such as: °? Redness. °? Scaly areas. °7. Look for changes in your nipples, such as: °? Discharge. °? Bleeding. °? Dimpling. °? Redness. °? A change in position. °Feel for Changes °Carefully feel your breasts for lumps and changes. It is best to do this while lying on your back on the floor and again while sitting or standing in the shower or tub with soapy water on your skin. Feel each breast in the following way: °· Place the arm on the side of the breast you are examining above your head. °· Feel your breast with the other hand. °· Start in the nipple area and make ¾ inch (2 cm) overlapping circles to feel your breast. Use the pads of your three middle fingers to do this. Apply light pressure, then medium pressure, then firm pressure. The light pressure will allow you to feel the tissue closest to the skin. The medium pressure will allow you to feel the tissue that is a little deeper. The firm pressure will allow you to feel the tissue  close to the ribs. °· Continue the overlapping circles, moving downward over the breast until you feel your ribs below your breast. °· Move one finger-width toward the center of the body. Continue to use the ¾ inch (2 cm) overlapping circles to feel your breast as you move slowly up toward your collarbone. °· Continue the up and down exam using all three pressures until you reach your armpit. ° °Write Down What You Find ° °Write down what is normal for each breast and any changes that you find. Keep a written record with breast changes or normal findings for each breast. By writing this information down, you do not need to depend only on memory for size, tenderness, or location. Write down where you are in your menstrual cycle, if you are still menstruating. °If you are having trouble noticing differences   in your breasts, do not get discouraged. With time you will become more familiar with the variations in your breasts and more comfortable with the exam. How often should I examine my breasts? Examine your breasts every month. If you are breastfeeding, the best time to examine your breasts is after a feeding or after using a breast pump. If you menstruate, the best time to examine your breasts is 5-7 days after your period is over. During your period, your breasts are lumpier, and it may be more difficult to notice changes. When should I see my health care provider? See your health care provider if you notice:  A change in shape or size of your breasts or nipples.  A change in the skin of your breast or nipples, such as a reddened or scaly area.  Unusual discharge from your nipples.  A lump or thick area that was not there before.  Pain in your breasts.  Anything that concerns you.  Medroxyprogesterone injection [Contraceptive] What is this medicine? MEDROXYPROGESTERONE (me DROX ee proe JES te rone) contraceptive injections prevent pregnancy. They provide effective birth control for 3 months.  Depo-subQ Provera 104 is also used for treating pain related to endometriosis. This medicine may be used for other purposes; ask your health care provider or pharmacist if you have questions. COMMON BRAND NAME(S): Depo-Provera, Depo-subQ Provera 104 What should I tell my health care provider before I take this medicine? They need to know if you have any of these conditions:  frequently drink alcohol  asthma  blood vessel disease or a history of a blood clot in the lungs or legs  bone disease such as osteoporosis  breast cancer  diabetes  eating disorder (anorexia nervosa or bulimia)  high blood pressure  HIV infection or AIDS  kidney disease  liver disease  mental depression  migraine  seizures (convulsions)  stroke  tobacco smoker  vaginal bleeding  an unusual or allergic reaction to medroxyprogesterone, other hormones, medicines, foods, dyes, or preservatives  pregnant or trying to get pregnant  breast-feeding How should I use this medicine? Depo-Provera Contraceptive injection is given into a muscle. Depo-subQ Provera 104 injection is given under the skin. These injections are given by a health care professional. You must not be pregnant before getting an injection. The injection is usually given during the first 5 days after the start of a menstrual period or 6 weeks after delivery of a baby. Talk to your pediatrician regarding the use of this medicine in children. Special care may be needed. These injections have been used in female children who have started having menstrual periods. Overdosage: If you think you have taken too much of this medicine contact a poison control center or emergency room at once. NOTE: This medicine is only for you. Do not share this medicine with others. What if I miss a dose? Try not to miss a dose. You must get an injection once every 3 months to maintain birth control. If you cannot keep an appointment, call and reschedule it. If  you wait longer than 13 weeks between Depo-Provera contraceptive injections or longer than 14 weeks between Depo-subQ Provera 104 injections, you could get pregnant. Use another method for birth control if you miss your appointment. You may also need a pregnancy test before receiving another injection. What may interact with this medicine? Do not take this medicine with any of the following medications:  bosentan This medicine may also interact with the following medications:  aminoglutethimide  antibiotics  or medicines for infections, especially rifampin, rifabutin, rifapentine, and griseofulvin  aprepitant  barbiturate medicines such as phenobarbital or primidone  bexarotene  carbamazepine  medicines for seizures like ethotoin, felbamate, oxcarbazepine, phenytoin, topiramate  modafinil  St. John's wort This list may not describe all possible interactions. Give your health care provider a list of all the medicines, herbs, non-prescription drugs, or dietary supplements you use. Also tell them if you smoke, drink alcohol, or use illegal drugs. Some items may interact with your medicine. What should I watch for while using this medicine? This drug does not protect you against HIV infection (AIDS) or other sexually transmitted diseases. Use of this product may cause you to lose calcium from your bones. Loss of calcium may cause weak bones (osteoporosis). Only use this product for more than 2 years if other forms of birth control are not right for you. The longer you use this product for birth control the more likely you will be at risk for weak bones. Ask your health care professional how you can keep strong bones. You may have a change in bleeding pattern or irregular periods. Many females stop having periods while taking this drug. If you have received your injections on time, your chance of being pregnant is very low. If you think you may be pregnant, see your health care professional as  soon as possible. Tell your health care professional if you want to get pregnant within the next year. The effect of this medicine may last a long time after you get your last injection. What side effects may I notice from receiving this medicine? Side effects that you should report to your doctor or health care professional as soon as possible:  allergic reactions like skin rash, itching or hives, swelling of the face, lips, or tongue  breast tenderness or discharge  breathing problems  changes in vision  depression  feeling faint or lightheaded, falls  fever  pain in the abdomen, chest, groin, or leg  problems with balance, talking, walking  unusually weak or tired  yellowing of the eyes or skin Side effects that usually do not require medical attention (report to your doctor or health care professional if they continue or are bothersome):  acne  fluid retention and swelling  headache  irregular periods, spotting, or absent periods  temporary pain, itching, or skin reaction at site where injected  weight gain This list may not describe all possible side effects. Call your doctor for medical advice about side effects. You may report side effects to FDA at 1-800-FDA-1088. Where should I keep my medicine? This does not apply. The injection will be given to you by a health care professional. NOTE: This sheet is a summary. It may not cover all possible information. If you have questions about this medicine, talk to your doctor, pharmacist, or health care provider.  2020 Elsevier/Gold Standard (2008-07-23 18:37:56)

## 2019-04-13 NOTE — Telephone Encounter (Signed)
-----   Message from Salvadore Dom, MD sent at 04/13/2019 10:05 AM EDT ----- Warden Fillers, Please set this young lady up for a right breast ultrasound, lump at 7 o'clock just outside the areolar region.  Thanks,Shamona Wirtz

## 2019-04-13 NOTE — Telephone Encounter (Signed)
Call placed to patient, mailbox full. MyChart not active.    Spoke with Benjamine Mola at [facility]. Patient scheduled for right breast US on 04/21/19 at 1:40pm, arrive at 1:20pm.

## 2019-04-14 LAB — COMPREHENSIVE METABOLIC PANEL
ALT: 7 IU/L (ref 0–32)
AST: 15 IU/L (ref 0–40)
Albumin/Globulin Ratio: 1.7 (ref 1.2–2.2)
Albumin: 4.5 g/dL (ref 3.9–5.0)
Alkaline Phosphatase: 66 IU/L (ref 39–117)
BUN/Creatinine Ratio: 7 — ABNORMAL LOW (ref 9–23)
BUN: 5 mg/dL — ABNORMAL LOW (ref 6–20)
Bilirubin Total: 0.4 mg/dL (ref 0.0–1.2)
CO2: 22 mmol/L (ref 20–29)
Calcium: 9.3 mg/dL (ref 8.7–10.2)
Chloride: 105 mmol/L (ref 96–106)
Creatinine, Ser: 0.72 mg/dL (ref 0.57–1.00)
GFR calc Af Amer: 136 mL/min/{1.73_m2} (ref >59)
GFR calc non Af Amer: 118 mL/min/{1.73_m2} (ref >59)
Globulin, Total: 2.7 g/dL (ref 1.5–4.5)
Glucose: 79 mg/dL (ref 65–99)
Potassium: 3.5 mmol/L (ref 3.5–5.2)
Sodium: 138 mmol/L (ref 134–144)
Total Protein: 7.2 g/dL (ref 6.0–8.5)

## 2019-04-14 LAB — LIPID PANEL
Chol/HDL Ratio: 1.8 ratio (ref 0.0–4.4)
Cholesterol, Total: 143 mg/dL (ref 100–199)
HDL: 81 mg/dL (ref >39)
LDL Chol Calc (NIH): 50 mg/dL (ref 0–99)
Triglycerides: 53 mg/dL (ref 0–149)
VLDL Cholesterol Cal: 12 mg/dL (ref 5–40)

## 2019-04-14 LAB — CBC
Hematocrit: 27.6 % — ABNORMAL LOW (ref 34.0–46.6)
Hemoglobin: 8.2 g/dL — ABNORMAL LOW (ref 11.1–15.9)
MCH: 21 pg — ABNORMAL LOW (ref 26.6–33.0)
MCHC: 29.7 g/dL — ABNORMAL LOW (ref 31.5–35.7)
MCV: 71 fL — ABNORMAL LOW (ref 79–97)
Platelets: 370 10*3/uL (ref 150–450)
RBC: 3.9 x10E6/uL (ref 3.77–5.28)
RDW: 16.3 % — ABNORMAL HIGH (ref 11.7–15.4)
WBC: 4.9 10*3/uL (ref 3.4–10.8)

## 2019-04-14 LAB — HEP, RPR, HIV PANEL
HIV Screen 4th Generation wRfx: NONREACTIVE
Hepatitis B Surface Ag: NEGATIVE
RPR Ser Ql: NONREACTIVE

## 2019-04-14 LAB — TSH: TSH: 1.02 u[IU]/mL (ref 0.450–4.500)

## 2019-04-14 LAB — FERRITIN: Ferritin: 3 ng/mL — ABNORMAL LOW (ref 15–150)

## 2019-04-14 LAB — HEPATITIS C ANTIBODY: Hep C Virus Ab: <0.1 s/co ratio (ref 0.0–0.9)

## 2019-04-15 ENCOUNTER — Telehealth: Payer: Self-pay

## 2019-04-15 NOTE — Telephone Encounter (Signed)
Attempted to reach patient at number provided. There was no answer and voicemail box is full. 

## 2019-04-15 NOTE — Telephone Encounter (Signed)
-----   Message from Salvadore Dom, MD sent at 04/15/2019 12:40 PM EDT ----- Please let the patient know that she has significant iron deficiency anemia. She just started on depo-provera which should help decrease her blood loss. Please set up a referral to hematology. She should restart OTC iron, but I think she would benefit from a hematology consult and an iron transfusion. Her pap and cervical cultures are pending, the rest of her lab work is normal.

## 2019-04-16 LAB — CYTOLOGY - PAP
Chlamydia: NEGATIVE
Molecular Disclaimer: NEGATIVE
Molecular Disclaimer: NEGATIVE
Molecular Disclaimer: NORMAL
Neisseria Gonorrhea: NEGATIVE
Trichomonas: NEGATIVE

## 2019-04-17 NOTE — Telephone Encounter (Signed)
Attempted to reach patient at number provided. There was no answer and voicemail box is full. 

## 2019-04-21 ENCOUNTER — Inpatient Hospital Stay: Admission: RE | Admit: 2019-04-21 | Payer: BLUE CROSS/BLUE SHIELD | Source: Ambulatory Visit

## 2019-04-27 NOTE — Telephone Encounter (Signed)
Notes recorded by Sprague, Harley Hallmark, RN on 04/20/2019 at 2:06 PM EDT  Letter mailed to patient's home address on file.

## 2019-04-27 NOTE — Telephone Encounter (Signed)
Per review of Epic patient did not show for her right breast US on 04/21/19 at [facility].   Patient r/s for right breast US at [facility] on 05/05/19 at 7:40am, arrive at 7:20am.    Patient notified of appt date and time. Patient verbalizes understanding and is agreeable.

## 2019-05-05 ENCOUNTER — Telehealth: Payer: Self-pay | Admitting: Family

## 2019-05-05 ENCOUNTER — Other Ambulatory Visit: Payer: Self-pay | Admitting: Obstetrics and Gynecology

## 2019-05-05 ENCOUNTER — Telehealth: Payer: Self-pay | Admitting: Obstetrics and Gynecology

## 2019-05-05 ENCOUNTER — Ambulatory Visit
Admission: RE | Admit: 2019-05-05 | Discharge: 2019-05-05 | Disposition: A | Discharge status: Home / Self Care | Payer: BC Managed Care – PPO | Source: Ambulatory Visit | Attending: Obstetrics and Gynecology | Admitting: Obstetrics and Gynecology

## 2019-05-05 ENCOUNTER — Other Ambulatory Visit: Payer: Self-pay

## 2019-05-05 DIAGNOSIS — N6313 Unspecified lump in the right breast, lower outer quadrant: Secondary | ICD-10-CM

## 2019-05-05 DIAGNOSIS — D509 Iron deficiency anemia, unspecified: Secondary | ICD-10-CM

## 2019-05-05 DIAGNOSIS — R87612 Low grade squamous intraepithelial lesion on cytologic smear of cervix (LGSIL): Secondary | ICD-10-CM

## 2019-05-05 DIAGNOSIS — N6310 Unspecified lump in the right breast, unspecified quadrant: Secondary | ICD-10-CM

## 2019-05-05 NOTE — Telephone Encounter (Signed)
Patient received letter that she needed to follow up on pap results.

## 2019-05-05 NOTE — Telephone Encounter (Signed)
Unable to reach patient due to voicemail box full to inform of new patient appt 11/2 at 830 am. Mailed appt letter 10/20 to inform patient of date/time/location of appointment

## 2019-05-05 NOTE — Telephone Encounter (Signed)
Notes recorded by Salvadore Dom, MD on 04/15/2019 at 12:40 PM EDT  Please let the patient know that she has significant iron deficiency anemia. She just started on depo-provera which should help decrease her blood loss. Please set up a referral to hematology. She should restart OTC iron, but I think she would benefit from a hematology consult and an iron transfusion.  Her pap and cervical cultures are pending, the rest of her lab work is normal.    Notes recorded by Salvadore Dom, MD on 04/17/2019 at 4:51 PM EDT  Please inform of her pap, she needs a colposcopy. If you can't reach her by phone, please send her a letter.

## 2019-05-05 NOTE — Telephone Encounter (Signed)
Spoke with patient. Advised of all results as seen below per Dr. Talbert Nan.   1. Patient agreeable to proceed with referral to hematology. Order placed to Dr. Marin Olp. Patient is aware she will be contact by referral coordinator with appt details once scheduled.   2. LMP 05/03/19. Depo provera for contraceptive. Brief explanation of colpo provided. Order placed for precert. Colpo scheduled for 11/2 at 4pm with Dr. Talbert Nan. Patient declined earlier appt offered. Patient verbalizes understanding and is agreeable.   Routing to provider for final review. Patient is agreeable to disposition. Will close encounter.  Cc: Lerry Liner, Magdalene Patricia

## 2019-05-06 ENCOUNTER — Telehealth: Payer: Self-pay | Admitting: Obstetrics and Gynecology

## 2019-05-06 NOTE — Telephone Encounter (Signed)
Call placed to convey benefits for colposcopy. Spoke with patient she understands/agreeable with the benefits. Patient is aware of the cancellation policy. Appointment scheduled 05/18/19.

## 2019-05-12 NOTE — Progress Notes (Signed)
GYNECOLOGY  VISIT   HPI: 23 y.o.   Single Black or African American Not Hispanic or Latino  female   [id number] with No LMP recorded.   here for evaluation of an abnormal pap smear. Recent pap returned with [facility]. No prior abnormal pap smears.      GYNECOLOGIC HISTORY: LMP: 05/05/2019 Contraception: Depo Menopausal hormone therapy: NA        OB History    Gravida  0   Para  0   Term  0   Preterm  0   AB  0   Living  0     SAB  0   TAB  0   Ectopic  0   Multiple  0   Live Births                 Patient Active Problem List   Diagnosis Date Noted  . Iron deficiency anemia due to chronic blood loss 01/24/2016  . 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 Anemia, iron deficiency 07/23/2013  . Menorrhagia 07/23/2013    Past Medical History:  Diagnosis Date  . Allergic rhinitis   . Anemia   . History of reactive airway disease     Past Surgical History:  Procedure Laterality Date  . LASIK    . MOUTH SURGERY    . WISDOM TOOTH EXTRACTION      No current outpatient medications on file.   No current facility-administered medications for this visit.      ALLERGIES: Patient has no known allergies.  Family History  Problem Relation Age of Onset  . Anemia Mother   . Diabetes Father   . Hypertension Father     Social History   Socioeconomic History  . Marital status: Single    Spouse name: Not on file  . Number of children: Not on file  . Years of education: Not on file  . Highest education level: Not on file  Occupational History  . Not on file  Social Needs  . Financial resource strain: Not on file  . Food insecurity    Worry: Not on file    Inability: Not on file  . Transportation needs    Medical: Not on file    Non-medical: Not on file  Tobacco Use  . Smoking status: Never Smoker  . Smokeless tobacco: Never Used  Substance and Sexual Activity  . Alcohol use: Yes    Alcohol/week: 0.0 standard drinks    Comment: occasional  . Drug use: Never  .  Sexual activity: Yes    Birth control/protection: None  Lifestyle  . Physical activity    Days per week: Not on file    Minutes per session: Not on file  . Stress: Not on file  Relationships  . Social Musician on phone: Not on file    Gets together: Not on file    Attends religious service: Not on file    Active member of club or organization: Not on file    Attends meetings of clubs or organizations: Not on file    Relationship status: Not on file  . Intimate partner violence    Fear of current or ex partner: Not on file    Emotionally abused: Not on file    Physically abused: Not on file    Forced sexual activity: Not on file  Other Topics Concern  . Not on file  Social History Narrative  . Not on file  Review of Systems  All other systems reviewed and are negative.   PHYSICAL EXAMINATION:    There were no vitals taken for this visit.    General appearance: alert, cooperative and appears stated age   Pelvic: External genitalia:  no lesions              Urethra:  normal appearing urethra with no masses, tenderness or lesions              Bartholins and Skenes: normal                 Vagina: normal appearing vagina with normal color and discharge, no lesions              Cervix: no gross lesions  Colposcopy: satisfactory, mild acetowhite changes and mild mosaics at 2 and 10 o'clock, biopsies taken. ECC done. Hemostasis achieved with silver nitrate.   Chaperone was present for exam.  ASSESSMENT LSIL pap    PLAN Discussed abnormal paps, hpv Colposcopy, biopsy and ECC done   An After Visit Summary was printed and given to the patient.

## 2019-05-15 ENCOUNTER — Other Ambulatory Visit: Payer: Self-pay | Admitting: Family

## 2019-05-15 DIAGNOSIS — D649 Anemia, unspecified: Secondary | ICD-10-CM

## 2019-05-18 ENCOUNTER — Inpatient Hospital Stay: Payer: BC Managed Care – PPO | Attending: Family | Admitting: Family

## 2019-05-18 ENCOUNTER — Inpatient Hospital Stay: Payer: BC Managed Care – PPO

## 2019-05-18 ENCOUNTER — Encounter: Payer: Self-pay | Admitting: Family

## 2019-05-18 ENCOUNTER — Ambulatory Visit: Payer: BC Managed Care – PPO | Admitting: Obstetrics and Gynecology

## 2019-05-18 ENCOUNTER — Encounter: Payer: Self-pay | Admitting: Obstetrics and Gynecology

## 2019-05-18 ENCOUNTER — Other Ambulatory Visit: Payer: Self-pay | Admitting: Obstetrics and Gynecology

## 2019-05-18 ENCOUNTER — Other Ambulatory Visit: Payer: Self-pay

## 2019-05-18 VITALS — BP 100/62 | HR 78 | Temp 97.9°F | Ht 63.0 in | Wt 144.0 lb

## 2019-05-18 VITALS — BP 118/79 | HR 79 | Temp 97.5°F | Resp 17 | Wt 141.0 lb

## 2019-05-18 DIAGNOSIS — D500 Iron deficiency anemia secondary to blood loss (chronic): Secondary | ICD-10-CM

## 2019-05-18 DIAGNOSIS — D509 Iron deficiency anemia, unspecified: Secondary | ICD-10-CM | POA: Diagnosis present

## 2019-05-18 DIAGNOSIS — R87612 Low grade squamous intraepithelial lesion on cytologic smear of cervix (LGSIL): Secondary | ICD-10-CM

## 2019-05-18 DIAGNOSIS — R5383 Other fatigue: Secondary | ICD-10-CM | POA: Insufficient documentation

## 2019-05-18 DIAGNOSIS — R202 Paresthesia of skin: Secondary | ICD-10-CM | POA: Insufficient documentation

## 2019-05-18 DIAGNOSIS — D649 Anemia, unspecified: Secondary | ICD-10-CM

## 2019-05-18 DIAGNOSIS — R42 Dizziness and giddiness: Secondary | ICD-10-CM | POA: Insufficient documentation

## 2019-05-18 DIAGNOSIS — Z01812 Encounter for preprocedural laboratory examination: Secondary | ICD-10-CM

## 2019-05-18 LAB — CMP (CANCER CENTER ONLY)
ALT: 7 U/L (ref 0–44)
AST: 12 U/L — ABNORMAL LOW (ref 15–41)
Albumin: 4.7 g/dL (ref 3.5–5.0)
Alkaline Phosphatase: 53 U/L (ref 38–126)
Anion gap: 10 (ref 5–15)
BUN: 9 mg/dL (ref 6–20)
CO2: 24 mmol/L (ref 22–32)
Calcium: 9.1 mg/dL (ref 8.9–10.3)
Chloride: 106 mmol/L (ref 98–111)
Creatinine: 0.69 mg/dL (ref 0.44–1.00)
GFR, Est AFR Am: >60 mL/min (ref >60)
GFR, Estimated: >60 mL/min (ref >60)
Glucose, Bld: 82 mg/dL (ref 70–99)
Potassium: 3.4 mmol/L — ABNORMAL LOW (ref 3.5–5.1)
Sodium: 140 mmol/L (ref 135–145)
Total Bilirubin: 0.4 mg/dL (ref 0.3–1.2)
Total Protein: 7.4 g/dL (ref 6.5–8.1)

## 2019-05-18 LAB — CBC WITH DIFFERENTIAL (CANCER CENTER ONLY)
Abs Immature Granulocytes: 0.02 10*3/uL (ref 0.00–0.07)
Basophils Absolute: 0.1 10*3/uL (ref 0.0–0.1)
Basophils Relative: 2 %
Eosinophils Absolute: 0.1 10*3/uL (ref 0.0–0.5)
Eosinophils Relative: 2 %
HCT: 32.7 % — ABNORMAL LOW (ref 36.0–46.0)
Hemoglobin: 9.2 g/dL — ABNORMAL LOW (ref 12.0–15.0)
Immature Granulocytes: 0 %
Lymphocytes Relative: 41 %
Lymphs Abs: 2.3 10*3/uL (ref 0.7–4.0)
MCH: 20.3 pg — ABNORMAL LOW (ref 26.0–34.0)
MCHC: 28.1 g/dL — ABNORMAL LOW (ref 30.0–36.0)
MCV: 72 fL — ABNORMAL LOW (ref 80.0–100.0)
Monocytes Absolute: 0.5 10*3/uL (ref 0.1–1.0)
Monocytes Relative: 9 %
Neutro Abs: 2.5 10*3/uL (ref 1.7–7.7)
Neutrophils Relative %: 46 %
Platelet Count: 356 10*3/uL (ref 150–400)
RBC: 4.54 MIL/uL (ref 3.87–5.11)
RDW: 21.5 % — ABNORMAL HIGH (ref 11.5–15.5)
WBC Count: 5.5 10*3/uL (ref 4.0–10.5)
nRBC: 0 % (ref 0.0–0.2)

## 2019-05-18 LAB — FERRITIN: Ferritin: 14 ng/mL (ref 11–307)

## 2019-05-18 LAB — RETICULOCYTES
Immature Retic Fract: 33.6 % — ABNORMAL HIGH (ref 2.3–15.9)
RBC.: 4.59 MIL/uL (ref 3.87–5.11)
Retic Count, Absolute: 104.2 10*3/uL (ref 19.0–186.0)
Retic Ct Pct: 2.3 % (ref 0.4–3.1)

## 2019-05-18 LAB — IRON AND TIBC
Iron: 13 ug/dL — ABNORMAL LOW (ref 41–142)
Saturation Ratios: 3 % — ABNORMAL LOW (ref 21–57)
TIBC: 383 ug/dL (ref 236–444)
UIBC: 370 ug/dL (ref 120–384)

## 2019-05-18 LAB — LACTATE DEHYDROGENASE: LDH: 171 U/L (ref 98–192)

## 2019-05-18 LAB — POCT URINE PREGNANCY: Preg Test, Ur: NEGATIVE

## 2019-05-18 LAB — SAVE SMEAR(SSMR), FOR PROVIDER SLIDE REVIEW

## 2019-05-18 NOTE — Patient Instructions (Signed)

## 2019-05-18 NOTE — Progress Notes (Signed)
Hematology/Oncology Consultation   Name: Faith Huff      MRN: [id number]    Location: Room/bed info not found  Date: 05/18/2019 Time:9:31 AM   REFERRING PHYSICIAN: Dorothy Spark, MD  REASON FOR CONSULT: Iron deficiency anemia    DIAGNOSIS: Iron deficiency anemia   HISTORY OF PRESENT ILLNESS: Faith Huff is a very pleasant 23 yo African American female with long history of iron deficiency anemia.  Ferritin in September was 3.  She is symptomatic with fatigue, dizziness and tingling in her fingertips when on her cycle.  She also has pica and eats soap ([place]). He mother and sisters also have pica (soap and paper) and are likely iron deficient as well.  She started taking an oral iron supplement twice daily and tolerating this well.  Her cycle is regular and heavy. She got her first Depo injection last month (will be every 3 months) and her most recent cycle was longer but not quite as heavy.  She had an abnormal pap and will be having a colposcopy later today.  She has no children and has never been pregnant.  She has had no other bleeding. No bruising or petechiae.  She had her wisdom teeth removed with no complications.  No sickle cell disease or trait that she is aware of.  No thyroid disease or diabetes.  No fever,chills, n/v, cough, rash, SOB, chest pain, palpitations, abdominal pain or changes in bowel or bladder habits.  No swelling or tenderness in her extremities.  She has position numbness and tingling in her arms when she sleeps a certain way.  She has maintained a good appetite but does not eat much meat. She states that red meat seemed to make her cycles heavier and more painful with cramping. She is staying well hydrated. Her weight is described as stable.  She does not smoke, drink alcoholic beverages or use recreational drugs.  She exercises regularly 4-5 days a week for 30 minutes at a time.  She works full time for a [employer] that [employer]. She really enjoys this.   ROS: All other 10 point review of systems is negative.   PAST MEDICAL HISTORY:   Past Medical History:  Diagnosis Date  . Allergic rhinitis   . Anemia   . History of reactive airway disease     ALLERGIES: No Known Allergies    MEDICATIONS:  Current Outpatient Medications on File Prior to Visit  Medication Sig Dispense Refill  . ferrous sulfate 324 MG TBEC Take 324 mg by mouth.    . medroxyPROGESTERone (DEPO-PROVERA) 150 MG/ML injection Inject 150 mg into the muscle every 3 (three) months.     No current facility-administered medications on file prior to visit.      PAST SURGICAL HISTORY Past Surgical History:  Procedure Laterality Date  . LASIK    . MOUTH SURGERY    . WISDOM TOOTH EXTRACTION      FAMILY HISTORY: Family History  Problem Relation Age of Onset  . Anemia Mother   . Diabetes Father   . Hypertension Father     SOCIAL HISTORY:  reports that she has never smoked. She has never used smokeless tobacco. She reports current alcohol use. She reports that she does not use drugs.  PERFORMANCE STATUS: The patient's performance status is 1 - Symptomatic but completely ambulatory  PHYSICAL EXAM: Most Recent Vital Signs: Blood pressure 118/79, pulse 79, temperature (!) 97.5 F (36.4 C), temperature source Temporal, resp. rate 17, weight  141 lb (64 kg), SpO2 100 %. BP 118/79 (BP Location: Left Arm, Patient Position: Sitting)   Pulse 79   Temp (!) 97.5 F (36.4 C) (Temporal)   Resp 17   Wt 141 lb (64 kg)   SpO2 100%   BMI 23.46 kg/m   General Appearance:    Alert, cooperative, no distress, appears stated age  Head:    Normocephalic, without obvious abnormality, atraumatic  Eyes:    PERRL, conjunctiva/corneas clear, EOM's intact, fundi    benign, both eyes        Throat:   Lips, mucosa, and tongue normal; teeth and gums normal  Neck:   Supple, symmetrical, trachea midline, no adenopathy;    thyroid:  no  enlargement/tenderness/nodules; no carotid   bruit or JVD  Back:     Symmetric, no curvature, ROM normal, no CVA tenderness  Lungs:     Clear to auscultation bilaterally, respirations unlabored  Chest Wall:    No tenderness or deformity   Heart:    Regular rate and rhythm, S1 and S2 normal, no murmur, rub   or gallop     Abdomen:     Soft, non-tender, bowel sounds active all four quadrants,    no masses, no organomegaly        Extremities:   Extremities normal, atraumatic, no cyanosis or edema  Pulses:   2+ and symmetric all extremities  Skin:   Skin color, texture, turgor normal, no rashes or lesions  Lymph nodes:   Cervical, supraclavicular, and axillary nodes normal  Neurologic:   CNII-XII intact, normal strength, sensation and reflexes    throughout    LABORATORY DATA:  Results for orders placed or performed in visit on 05/18/19 (from the past 48 hour(s))  Save Smear (SSMR)     Status: None   Collection Time: 05/18/19  9:05 AM  Result Value Ref Range   Smear Review SMEAR STAINED AND AVAILABLE FOR REVIEW     Comment: Performed at [facility] at [facility] [address], [place], [place] [address]  CBC with Differential (Cancer Center Only)     Status: Abnormal   Collection Time: 05/18/19  9:05 AM  Result Value Ref Range   WBC Count 5.5 4.0 - 10.5 K/uL   RBC 4.54 3.87 - 5.11 MIL/uL   Hemoglobin 9.2 (L) 12.0 - 15.0 g/dL    Comment: Reticulocyte Hemoglobin testing may be clinically indicated, consider ordering this additional test [id number]    HCT 32.7 (L) 36.0 - 46.0 %   MCV 72.0 (L) 80.0 - 100.0 fL   MCH 20.3 (L) 26.0 - 34.0 pg   MCHC 28.1 (L) 30.0 - 36.0 g/dL   RDW 21.5 (H) 11.5 - 15.5 %   Platelet Count 356 150 - 400 K/uL   nRBC 0.0 0.0 - 0.2 %   Neutrophils Relative % 46 %   Neutro Abs 2.5 1.7 - 7.7 K/uL   Lymphocytes Relative 41 %   Lymphs Abs 2.3 0.7 - 4.0 K/uL   Monocytes Relative 9 %   Monocytes Absolute 0.5 0.1 - 1.0 K/uL    Eosinophils Relative 2 %   Eosinophils Absolute 0.1 0.0 - 0.5 K/uL   Basophils Relative 2 %   Basophils Absolute 0.1 0.0 - 0.1 K/uL   Immature Granulocytes 0 %   Abs Immature Granulocytes 0.02 0.00 - 0.07 K/uL    Comment: Performed at [facility] at [facility], [address], [place], [place] [address]  Reticulocytes     Status: Abnormal   Collection Time: 05/18/19  9:06 AM  Result Value Ref Range   Retic Ct Pct 2.3 0.4 - 3.1 %   RBC. 4.59 3.87 - 5.11 MIL/uL   Retic Count, Absolute 104.2 19.0 - 186.0 K/uL   Immature Retic Fract 33.6 (H) 2.3 - 15.9 %    Comment: Performed at [facility] at [facility], [address] [place], [place] [address]      RADIOGRAPHY: No results found.     PATHOLOGY: None  ASSESSMENT/PLAN: Ms. Bianca is a very pleasant 23 yo African American female with long history of iron deficiency anemia.  She is tolerating oral iron nicely at this time and her Hgb and MCV are slowly improving.  We will see what her lab work shows and determine if she would better benefit from IV iron and/or folic acid.  Once we have her results we will get her scheduled for follow-up.   All questions were answered and she is in agreement with the plan. She will contact our office with any questions or concerns. We can certainly see her sooner if needed.   She was discussed with and also seen by Dr. Marin Olp and he is in agreement with the aforementioned.   Laverna Peace, NP    Addendum: I saw and examined Ms. Philipp Ovens with Judson Roch.  I agree with the above assessment.  Her iron studies clearly show marked iron deficiency.  Her ferritin is only 14 with an iron saturation of only 3%.  She clearly will need IV iron.  I looked at her blood smear.  I do not see any target cells that would suggest thalassemia.  I think she has clearly just iron deficiency..  She has heavy cycles.  Hopefully these can be treated or adjusted  by her gynecologist.  I know that we will be able to get her hemoglobin back up.  Hopefully, she will be able to get back up to a normal range where she will be able to feel better.  I do not see where she needs to have a bone marrow biopsy done.  I do not think she needs to have any x-rays done.  I do not think there is any underlying hematologic malignancy that is causing any of this.  We will see about getting the iron set up.  She will need 2 doses.  I know that she is taking oral iron.  I just do not know if this really is helping.  We would like to see her back in about 6 weeks.  By then, her hemoglobin should be close to 11.  Her MCV should be over 80.  We spent about 45 minutes with Ms. Yodice.  We reviewed her lab work.  We answered her questions.  Lattie Haw, MD

## 2019-05-19 ENCOUNTER — Telehealth: Payer: Self-pay | Admitting: Hematology & Oncology

## 2019-05-19 ENCOUNTER — Other Ambulatory Visit: Payer: Self-pay | Admitting: Family

## 2019-05-19 LAB — ERYTHROPOIETIN: Erythropoietin: 38.1 m[IU]/mL — ABNORMAL HIGH (ref 2.6–18.5)

## 2019-05-19 NOTE — Telephone Encounter (Signed)
Called and spoke with patients father and he was given date/time of appointments added per 11/2 los

## 2019-05-20 LAB — HEMOGLOBINOPATHY EVALUATION
Hgb A2 Quant: 2 % (ref 1.8–3.2)
Hgb A: 98 % (ref 96.4–98.8)
Hgb C: 0 %
Hgb F Quant: 0 % (ref 0.0–2.0)
Hgb S Quant: 0 %
Hgb Variant: 0 %

## 2019-05-22 ENCOUNTER — Other Ambulatory Visit: Payer: Self-pay

## 2019-05-22 ENCOUNTER — Inpatient Hospital Stay: Payer: BC Managed Care – PPO

## 2019-05-22 VITALS — BP 105/61 | HR 85 | Temp 97.9°F | Resp 17

## 2019-05-22 DIAGNOSIS — D509 Iron deficiency anemia, unspecified: Secondary | ICD-10-CM | POA: Diagnosis not present

## 2019-05-22 MED ORDER — SODIUM CHLORIDE 0.9 % IV SOLN
Freq: Once | INTRAVENOUS | Status: AC
  Administered 2019-05-22: 13:00:00 via INTRAVENOUS
  Filled 2019-05-22: qty 250

## 2019-05-22 MED ORDER — SODIUM CHLORIDE 0.9 % IV SOLN
510.0000 mg | Freq: Once | INTRAVENOUS | Status: AC
  Administered 2019-05-22: 510 mg via INTRAVENOUS
  Filled 2019-05-22: qty 17

## 2019-05-22 NOTE — Patient Instructions (Signed)

## 2019-05-28 ENCOUNTER — Other Ambulatory Visit: Payer: Self-pay

## 2019-05-28 ENCOUNTER — Inpatient Hospital Stay: Payer: BC Managed Care – PPO

## 2019-05-28 VITALS — BP 96/63 | HR 87 | Temp 97.8°F | Resp 18

## 2019-05-28 DIAGNOSIS — D509 Iron deficiency anemia, unspecified: Secondary | ICD-10-CM | POA: Diagnosis not present

## 2019-05-28 MED ORDER — SODIUM CHLORIDE 0.9 % IV SOLN
Freq: Once | INTRAVENOUS | Status: AC
  Administered 2019-05-28: 13:00:00 via INTRAVENOUS
  Filled 2019-05-28: qty 250

## 2019-05-28 MED ORDER — SODIUM CHLORIDE 0.9 % IV SOLN
510.0000 mg | Freq: Once | INTRAVENOUS | Status: AC
  Administered 2019-05-28: 510 mg via INTRAVENOUS
  Filled 2019-05-28: qty 17

## 2019-05-28 NOTE — Patient Instructions (Signed)

## 2019-05-29 LAB — ALPHA-THALASSEMIA GENOTYPR

## 2019-06-29 ENCOUNTER — Other Ambulatory Visit: Payer: BC Managed Care – PPO

## 2019-06-29 ENCOUNTER — Ambulatory Visit: Payer: BC Managed Care – PPO | Admitting: Hematology & Oncology

## 2019-07-01 NOTE — Progress Notes (Signed)
Patient is here for Depo Provera Injection Patient is within Depo Provera Calender Limits yes, last given 04-13-2019  Next Depo Due between: 3/4 to 3/18  Last AEX: 04-13-2019 JJ  AEX Scheduled: not currently scheduled   Patient is aware when next depo is due  Pt tolerated Injection well in [place].   Routed to provider for review, encounter closed.

## 2019-07-02 ENCOUNTER — Ambulatory Visit (INDEPENDENT_AMBULATORY_CARE_PROVIDER_SITE_OTHER): Payer: BC Managed Care – PPO | Admitting: *Deleted

## 2019-07-02 ENCOUNTER — Inpatient Hospital Stay: Payer: BC Managed Care – PPO | Attending: Family

## 2019-07-02 ENCOUNTER — Telehealth: Payer: Self-pay | Admitting: *Deleted

## 2019-07-02 ENCOUNTER — Encounter: Payer: Self-pay | Admitting: Hematology & Oncology

## 2019-07-02 ENCOUNTER — Inpatient Hospital Stay (HOSPITAL_BASED_OUTPATIENT_CLINIC_OR_DEPARTMENT_OTHER): Payer: BC Managed Care – PPO | Admitting: Hematology & Oncology

## 2019-07-02 ENCOUNTER — Other Ambulatory Visit: Payer: Self-pay

## 2019-07-02 ENCOUNTER — Telehealth: Payer: Self-pay | Admitting: Hematology & Oncology

## 2019-07-02 VITALS — BP 98/56 | HR 80 | Temp 97.2°F | Resp 14 | Ht 63.0 in | Wt 139.0 lb

## 2019-07-02 VITALS — BP 106/75 | HR 73 | Temp 97.3°F | Resp 16 | Wt 138.0 lb

## 2019-07-02 DIAGNOSIS — N921 Excessive and frequent menstruation with irregular cycle: Secondary | ICD-10-CM | POA: Diagnosis not present

## 2019-07-02 DIAGNOSIS — Z3042 Encounter for surveillance of injectable contraceptive: Secondary | ICD-10-CM

## 2019-07-02 DIAGNOSIS — D500 Iron deficiency anemia secondary to blood loss (chronic): Secondary | ICD-10-CM | POA: Diagnosis present

## 2019-07-02 LAB — CBC WITH DIFFERENTIAL (CANCER CENTER ONLY)
Abs Immature Granulocytes: 0.04 10*3/uL (ref 0.00–0.07)
Basophils Absolute: 0.1 10*3/uL (ref 0.0–0.1)
Basophils Relative: 2 %
Eosinophils Absolute: 0.2 10*3/uL (ref 0.0–0.5)
Eosinophils Relative: 5 %
HCT: 41.1 % (ref 36.0–46.0)
Hemoglobin: 12.7 g/dL (ref 12.0–15.0)
Immature Granulocytes: 1 %
Lymphocytes Relative: 46 %
Lymphs Abs: 1.9 10*3/uL (ref 0.7–4.0)
MCH: 24.4 pg — ABNORMAL LOW (ref 26.0–34.0)
MCHC: 30.9 g/dL (ref 30.0–36.0)
MCV: 78.9 fL — ABNORMAL LOW (ref 80.0–100.0)
Monocytes Absolute: 0.3 10*3/uL (ref 0.1–1.0)
Monocytes Relative: 8 %
Neutro Abs: 1.6 10*3/uL — ABNORMAL LOW (ref 1.7–7.7)
Neutrophils Relative %: 38 %
Platelet Count: 257 10*3/uL (ref 150–400)
RBC: 5.21 MIL/uL — ABNORMAL HIGH (ref 3.87–5.11)
RDW: 21.4 % — ABNORMAL HIGH (ref 11.5–15.5)
WBC Count: 4.2 10*3/uL (ref 4.0–10.5)
nRBC: 0 % (ref 0.0–0.2)

## 2019-07-02 LAB — FERRITIN: Ferritin: 126 ng/mL (ref 11–307)

## 2019-07-02 LAB — IRON AND TIBC
Iron: 105 ug/dL (ref 41–142)
Saturation Ratios: 44 % (ref 21–57)
TIBC: 236 ug/dL (ref 236–444)
UIBC: 131 ug/dL (ref 120–384)

## 2019-07-02 LAB — RETICULOCYTES
Immature Retic Fract: 5.7 % (ref 2.3–15.9)
RBC.: 5.14 MIL/uL — ABNORMAL HIGH (ref 3.87–5.11)
Retic Count, Absolute: 32.4 10*3/uL (ref 19.0–186.0)
Retic Ct Pct: 0.6 % (ref 0.4–3.1)

## 2019-07-02 MED ORDER — MEDROXYPROGESTERONE ACETATE 150 MG/ML IM SUSP
150.0000 mg | Freq: Once | INTRAMUSCULAR | Status: AC
  Administered 2019-07-02: 150 mg via INTRAMUSCULAR

## 2019-07-02 NOTE — Telephone Encounter (Signed)
-----   Message from Volanda Napoleon, MD sent at 07/02/2019  3:09 PM EST ----- Call - the iron level is fantastic!!!  Merry Christmas!  Laurey Arrow

## 2019-07-02 NOTE — Telephone Encounter (Signed)
As noted below by Dr. Marin Olp, I tried calling the patient to inform her that the iron level is normal. Her phone is full of messages, unable to leave her a message. Will continue to try calling.

## 2019-07-02 NOTE — Progress Notes (Signed)
Hematology and Oncology Follow Up Visit  Faith Huff [id number] January 05, 1996 23 y.o. 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  We first saw her back in November.  At that time, her ferritin was only 14 with an iron saturation of 3%.  We did a hemoglobin letter pheresis on her.  She had a normal hemoglobin pattern.  Her erythropoietin level was 38.1.  Para we did go ahead and give her some IV iron.  This has worked incredibly well.  Hemoglobin has gone up to 12.7.  She is not chewing ice.  She feels better.  She has more energy.  She I think is on oral contraceptives so I will think she has a cycle.  She is working without any difficulties.  She had a very nice Thanksgiving.  Overall, her performance status is ECOG 0.  Medications:  Current Outpatient Medications:  .  medroxyPROGESTERone (DEPO-PROVERA) 150 MG/ML injection, Inject 150 mg into the muscle every 3 (three) months., Disp: , Rfl:   Allergies: No Known Allergies  Past Medical History, Surgical history, Social history, and Family History were reviewed and updated.  Review of Systems: Review of Systems  Constitutional: Negative.   HENT:  Negative.   Eyes: Negative.   Respiratory: Negative.   Cardiovascular: Negative.   Gastrointestinal: Negative.   Endocrine: Negative.   Genitourinary: Negative.    Musculoskeletal: Negative.   Skin: Negative.   Neurological: Negative.   Hematological: Negative.   Psychiatric/Behavioral: Negative.     Physical Exam:  weight is 138 lb (62.6 kg). Her temporal temperature is 97.3 F (36.3 C) (abnormal). Her blood pressure is 106/75 and her pulse is 73. Her respiration is 16 and oxygen saturation is 100%.   Wt Readings from Last 3 Encounters:  07/02/19 138 lb (62.6 kg)  05/18/19 144 lb (65.3 kg)    05/18/19 141 lb (64 kg)    Physical Exam Vitals reviewed.  HENT:     Head: Normocephalic and atraumatic.  Eyes:     Pupils: Pupils are equal, round, and reactive to light.  Cardiovascular:     Rate and Rhythm: Normal rate and regular rhythm.     Heart sounds: Normal heart sounds.  Pulmonary:     Effort: Pulmonary effort is normal.     Breath sounds: Normal breath sounds.  Abdominal:     General: Bowel sounds are normal.     Palpations: Abdomen is soft.  Musculoskeletal:        General: No tenderness or deformity. Normal range of motion.     Cervical back: Normal range of motion.  Lymphadenopathy:     Cervical: No cervical adenopathy.  Skin:    General: Skin is warm and dry.     Findings: No erythema or rash.  Neurological:     Mental Status: She is alert and oriented to person, place, and time.  Psychiatric:        Behavior: Behavior normal.        Thought Content: Thought content normal.        Judgment: Judgment normal.      Lab Results  Component Value Date   WBC 4.2 07/02/2019   HGB 12.7 07/02/2019   HCT 41.1 07/02/2019   MCV 78.9 (L) 07/02/2019   PLT 257 07/02/2019     Chemistry  Component Value Date/Time   NA 140 05/18/2019 0905   NA 138 04/13/2019 1010   K 3.4 (L) 05/18/2019 0905   CL 106 05/18/2019 0905   CO2 24 05/18/2019 0905   BUN 9 05/18/2019 0905   BUN 5 (L) 04/13/2019 1010   CREATININE 0.69 05/18/2019 0905      Component Value Date/Time   CALCIUM 9.1 05/18/2019 0905   ALKPHOS 53 05/18/2019 0905   AST 12 (L) 05/18/2019 0905   ALT 7 05/18/2019 0905   BILITOT 0.4 05/18/2019 0905       Impression and Plan: Faith Huff is a 23 year old African-American female.  She has iron deficiency anemia.  We have done a good job with this so far.  Her MCV is better.  It is now 39.  I would suspect that she is still somewhat iron deficient.  If so, we will go ahead and give her some more IV iron.  I think we can now get her back in 3 months  or so.  We can get her through the wintertime.  I know that she is busy.  I will try to make this as easy as possible for her.     Josph Macho, MD 12/17/20208:28 AM

## 2019-07-02 NOTE — Telephone Encounter (Signed)
Appointments scheduled patient declined calendar/avs 12/17 los

## 2019-08-12 ENCOUNTER — Encounter: Payer: Self-pay | Admitting: Family Medicine

## 2019-08-13 ENCOUNTER — Encounter: Payer: Self-pay | Admitting: Family Medicine

## 2019-09-09 ENCOUNTER — Telehealth: Payer: Self-pay | Admitting: Obstetrics and Gynecology

## 2019-09-09 NOTE — Telephone Encounter (Signed)
Call placed to patient to reschedule nurse visit 09/18/19. Voicemail full.

## 2019-09-16 ENCOUNTER — Other Ambulatory Visit: Payer: Self-pay

## 2019-09-17 ENCOUNTER — Ambulatory Visit (INDEPENDENT_AMBULATORY_CARE_PROVIDER_SITE_OTHER): Payer: BC Managed Care – PPO | Admitting: Obstetrics and Gynecology

## 2019-09-17 ENCOUNTER — Encounter: Payer: Self-pay | Admitting: Obstetrics and Gynecology

## 2019-09-17 VITALS — BP 90/60 | HR 74 | Temp 97.5°F | Ht 64.0 in | Wt 136.2 lb

## 2019-09-17 DIAGNOSIS — Z3042 Encounter for surveillance of injectable contraceptive: Secondary | ICD-10-CM | POA: Diagnosis not present

## 2019-09-17 MED ORDER — MEDROXYPROGESTERONE ACETATE 150 MG/ML IM SUSP
150.0000 mg | Freq: Once | INTRAMUSCULAR | Status: AC
  Administered 2019-09-17: 150 mg via INTRAMUSCULAR

## 2019-09-17 NOTE — Progress Notes (Signed)
Patient is here for Depo Provera Injection Patient is within Depo Provera Calender Limits 3/4-3/18/21 Next Depo Due between: 5/20-12/17/19 Last AEX: 04/14/2019 with JJ AEX Scheduled: 10/4/2021with JJ  Patient is aware when next depo is due  Pt tolerated Injection well in [place].  Routed to provider for review, encounter closed.

## 2019-09-18 ENCOUNTER — Ambulatory Visit: Payer: BC Managed Care – PPO

## 2019-09-18 ENCOUNTER — Telehealth: Payer: Self-pay | Admitting: Obstetrics and Gynecology

## 2019-09-18 NOTE — Telephone Encounter (Signed)
Patient is calling to give an update the depo provera injection she was given yesterday.

## 2019-09-18 NOTE — Telephone Encounter (Signed)
Spoke to pt. Pt states noticing lump/knot that come up 6-7 hours after injection of Depo given on 09/17/2019 . Pt states same size today as last night. Pt denies trouble breathing, pain, fever/redness at sight or knot increasing in size. Pt also states that has been spotting ever since on starting depo 6 months ago. Pt advised to be evaluated by Dr Oscar La. OV scheduled on 09/21/19 at 4:30 pm. Pt agreeable. Pt advised/instructed to seek urgent or emergency care over weekend with any airway, severe pain or swelling. Pt agreeable.   Routing to Dr Oscar La for review and will close encounter.

## 2019-09-18 NOTE — Telephone Encounter (Signed)
Attempted to call pt. Voicemail box full. If pt calls back to office, to speak with Judeth Cornfield, Charity fundraiser.

## 2019-09-21 ENCOUNTER — Ambulatory Visit: Payer: Self-pay | Admitting: Obstetrics and Gynecology

## 2019-09-23 ENCOUNTER — Encounter: Payer: Self-pay | Admitting: Obstetrics and Gynecology

## 2019-09-23 ENCOUNTER — Ambulatory Visit (INDEPENDENT_AMBULATORY_CARE_PROVIDER_SITE_OTHER): Payer: BC Managed Care – PPO | Admitting: Obstetrics and Gynecology

## 2019-09-23 ENCOUNTER — Other Ambulatory Visit: Payer: Self-pay

## 2019-09-23 VITALS — BP 108/62 | HR 102 | Temp 97.7°F | Ht 64.0 in | Wt 137.0 lb

## 2019-09-23 DIAGNOSIS — Z8742 Personal history of other diseases of the female genital tract: Secondary | ICD-10-CM | POA: Diagnosis not present

## 2019-09-23 DIAGNOSIS — Z862 Personal history of diseases of the blood and blood-forming organs and certain disorders involving the immune mechanism: Secondary | ICD-10-CM

## 2019-09-23 DIAGNOSIS — Z3049 Encounter for surveillance of other contraceptives: Secondary | ICD-10-CM | POA: Diagnosis not present

## 2019-09-23 NOTE — Progress Notes (Signed)
GYNECOLOGY  VISIT   HPI: 24 y.o.   Single Black or African American Not Hispanic or Latino  female   [id number] with Patient's last menstrual period was 09/14/2019 (approximate).   here for   Spotting with depo. Patient states that right after she gets her depo she has not bleeding but about month 2 she starts to lightly spot and by month 3 she is having a moderate flow with some clotting. She states that it feels like there is a bead in her left thigh. She says its not painful.   She has a h/o severe anemia from menorrhagia when off of contraception. She previously declined having an ultrasound, normal exam in 9/20.  She was started on Depo-Provera at the end of September, 2020.  Depo Provera on 04/13/19, 07/02/19, 09/17/18. After the first shot she had what seemed like to full cycles in 10/20 which extended into November, moderate flow. Doesn't remember bleeding in 12/20, some spotting towards the end of January. In February she spotted for weeks, needed a liner. No real period since her 12/20 shot.  Definitely feeling better.   Hgb was 8.2 in 9/20. She was seen by Dr Marin Olp and had an iron transfusion. Not anemic at last check in 12/2.  She noticed a non tender lump in her anterior left thigh last week.    GYNECOLOGIC HISTORY: Patient's last menstrual period was 09/14/2019 (approximate). Contraception:DEPO Menopausal hormone therapy: none        OB History    Gravida  0   Para  0   Term  0   Preterm  0   AB  0   Living  0     SAB  0   TAB  0   Ectopic  0   Multiple  0   Live Births                 Patient Active Problem List   Diagnosis Date Noted  . Iron deficiency anemia due to chronic blood loss 01/24/2016  . 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 Anemia, iron deficiency 07/23/2013  . Menorrhagia 07/23/2013    Past Medical History:  Diagnosis Date  . Allergic rhinitis   . Anemia   . History of reactive airway disease     Past Surgical History:  Procedure  Laterality Date  . LASIK    . MOUTH SURGERY    . WISDOM TOOTH EXTRACTION      Current Outpatient Medications  Medication Sig Dispense Refill  . medroxyPROGESTERone (DEPO-PROVERA) 150 MG/ML injection Inject 150 mg into the muscle every 3 (three) months.     No current facility-administered medications for this visit.     ALLERGIES: Patient has no known allergies.  Family History  Problem Relation Age of Onset  . Anemia Mother   . Diabetes Father   . Hypertension Father     Social History   Socioeconomic History  . Marital status: Single    Spouse name: Not on file  . Number of children: Not on file  . Years of education: Not on file  . Highest education level: Not on file  Occupational History  . Not on file  Tobacco Use  . Smoking status: Never Smoker  . Smokeless tobacco: Never Used  Substance and Sexual Activity  . Alcohol use: Yes    Alcohol/week: 0.0 standard drinks    Comment: occasional  . Drug use: Never  . Sexual activity: Yes    Birth control/protection: None  Other Topics Concern  . Not on file  Social History Narrative  . Not on file   Social Determinants of Health   Financial Resource Strain:   . Difficulty of Paying Living Expenses: Not on file  Food Insecurity:   . Worried About Programme researcher, broadcasting/film/video in the Last Year: Not on file  . Ran Out of Food in the Last Year: Not on file  Transportation Needs:   . Lack of Transportation (Medical): Not on file  . Lack of Transportation (Non-Medical): Not on file  Physical Activity:   . Days of Exercise per Week: Not on file  . Minutes of Exercise per Session: Not on file  Stress:   . Feeling of Stress : Not on file  Social Connections:   . Frequency of Communication with Friends and Family: Not on file  . Frequency of Social Gatherings with Friends and Family: Not on file  . Attends Religious Services: Not on file  . Active Member of Clubs or Organizations: Not on file  . Attends Tax inspector Meetings: Not on file  . Marital Status: Not on file  Intimate Partner Violence:   . Fear of Current or Ex-Partner: Not on file  . Emotionally Abused: Not on file  . Physically Abused: Not on file  . Sexually Abused: Not on file    Review of Systems  All other systems reviewed and are negative.   PHYSICAL EXAMINATION:    BP 108/62   Pulse (!) 102   Temp 97.7 F (36.5 C)   Ht 5\' 4"  (1.626 m)   Wt 137 lb (62.1 kg)   LMP 09/14/2019 (Approximate)   SpO2 96%   BMI 23.52 kg/m     General appearance: alert, cooperative and appears stated age Left anterior thigh: normal in appearance, under the skin in her mid anterior thigh is a 5-6 mm, smooth lump, not tender No calf tenderness, negative homans sign    ASSESSMENT Spotting on Depo-Provera, mild and improving. Bleeding is so much better on the Depo-Provera H/O menorrhagia leading to severe anemia Anemia currently corrected with Depo-Provera and iron transfusion. Lump left thigh, small, benign feeling. Recommended she f/u with her primary.     PLAN Continue Depo-Provera She will f/u with Dr 11/14/2019 for her anemia, controlled at last check  After the patient left I searched through her chart trying to understand why she didn't have an ultrasound. She previously refused an ultrasound. Called patient to discuss, her voice mail was full. Will send her a letter recommending an ultrasound secondary to her h/o severe menorrhagia leading to anemia.

## 2019-10-12 ENCOUNTER — Other Ambulatory Visit: Payer: Self-pay

## 2019-10-12 ENCOUNTER — Ambulatory Visit: Payer: BC Managed Care – PPO | Admitting: Hematology & Oncology

## 2019-10-12 ENCOUNTER — Inpatient Hospital Stay: Payer: BC Managed Care – PPO

## 2019-10-12 ENCOUNTER — Inpatient Hospital Stay: Payer: BC Managed Care – PPO | Admitting: Family

## 2019-10-16 ENCOUNTER — Other Ambulatory Visit: Payer: Self-pay

## 2019-10-16 ENCOUNTER — Inpatient Hospital Stay: Payer: 59 | Attending: Family

## 2019-10-16 ENCOUNTER — Encounter: Payer: Self-pay | Admitting: Family

## 2019-10-16 ENCOUNTER — Telehealth: Payer: Self-pay | Admitting: Family

## 2019-10-16 ENCOUNTER — Inpatient Hospital Stay (HOSPITAL_BASED_OUTPATIENT_CLINIC_OR_DEPARTMENT_OTHER): Payer: 59 | Admitting: Family

## 2019-10-16 VITALS — BP 106/70 | HR 77 | Temp 97.3°F | Resp 18 | Ht 64.0 in | Wt 136.1 lb

## 2019-10-16 DIAGNOSIS — D500 Iron deficiency anemia secondary to blood loss (chronic): Secondary | ICD-10-CM

## 2019-10-16 DIAGNOSIS — N921 Excessive and frequent menstruation with irregular cycle: Secondary | ICD-10-CM | POA: Diagnosis present

## 2019-10-16 LAB — CMP (CANCER CENTER ONLY)
ALT: 12 U/L (ref 0–44)
AST: 17 U/L (ref 15–41)
Albumin: 4.6 g/dL (ref 3.5–5.0)
Alkaline Phosphatase: 47 U/L (ref 38–126)
Anion gap: 8 (ref 5–15)
BUN: 10 mg/dL (ref 6–20)
CO2: 26 mmol/L (ref 22–32)
Calcium: 9.4 mg/dL (ref 8.9–10.3)
Chloride: 107 mmol/L (ref 98–111)
Creatinine: 0.74 mg/dL (ref 0.44–1.00)
GFR, Est AFR Am: >60 mL/min (ref >60)
GFR, Estimated: >60 mL/min (ref >60)
Glucose, Bld: 88 mg/dL (ref 70–99)
Potassium: 3.6 mmol/L (ref 3.5–5.1)
Sodium: 141 mmol/L (ref 135–145)
Total Bilirubin: 0.4 mg/dL (ref 0.3–1.2)
Total Protein: 7.2 g/dL (ref 6.5–8.1)

## 2019-10-16 LAB — RETICULOCYTES
Immature Retic Fract: 10.3 % (ref 2.3–15.9)
RBC.: 4.57 MIL/uL (ref 3.87–5.11)
Retic Count, Absolute: 52.6 10*3/uL (ref 19.0–186.0)
Retic Ct Pct: 1.2 % (ref 0.4–3.1)

## 2019-10-16 LAB — CBC WITH DIFFERENTIAL (CANCER CENTER ONLY)
Abs Immature Granulocytes: 0.03 10*3/uL (ref 0.00–0.07)
Basophils Absolute: 0.1 10*3/uL (ref 0.0–0.1)
Basophils Relative: 1 %
Eosinophils Absolute: 0.1 10*3/uL (ref 0.0–0.5)
Eosinophils Relative: 3 %
HCT: 40.6 % (ref 36.0–46.0)
Hemoglobin: 13.1 g/dL (ref 12.0–15.0)
Immature Granulocytes: 1 %
Lymphocytes Relative: 55 %
Lymphs Abs: 2.3 10*3/uL (ref 0.7–4.0)
MCH: 28.5 pg (ref 26.0–34.0)
MCHC: 32.3 g/dL (ref 30.0–36.0)
MCV: 88.5 fL (ref 80.0–100.0)
Monocytes Absolute: 0.4 10*3/uL (ref 0.1–1.0)
Monocytes Relative: 9 %
Neutro Abs: 1.3 10*3/uL — ABNORMAL LOW (ref 1.7–7.7)
Neutrophils Relative %: 31 %
Platelet Count: 271 10*3/uL (ref 150–400)
RBC: 4.59 MIL/uL (ref 3.87–5.11)
RDW: 13.3 % (ref 11.5–15.5)
WBC Count: 4.2 10*3/uL (ref 4.0–10.5)
nRBC: 0 % (ref 0.0–0.2)

## 2019-10-16 NOTE — Progress Notes (Signed)
Hematology and Oncology Follow Up Visit  Faith Huff [id number] 06/21/1996 23 y.o. 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 She is doing well and has no complaints at this time.  She is doing well on Depo injections and now only spots randomly.  No other blood loss noted. No bruising or petechiae.  No fever, chills, n/v, cough, rash, dizziness, SOB, chest pain, palpitations, abdominal pain or changes in bowel or bladder habits. No swelling, tenderness, numbness or tingling in her extremities.  No falls or syncope.  She has maintained a good appetite and still does not eat a lot of meat. She is hydrating well and her weight is stable.  She is still exercising regularly.   ECOG Performance Status: 1 - Symptomatic but completely ambulatory  Medications:  Allergies as of 10/16/2019   No Known Allergies     Medication List       Accurate as of October 16, 2019  1:06 PM. If you have any questions, ask your nurse or doctor.        medroxyPROGESTERone 150 MG/ML injection Commonly known as: DEPO-PROVERA Inject 150 mg into the muscle every 3 (three) months.       Allergies: No Known Allergies  Past Medical History, Surgical history, Social history, and Family History were reviewed and updated.  Review of Systems: All other 10 point review of systems is negative.   Physical Exam:  vitals were not taken for this visit.   Wt Readings from Last 3 Encounters:  09/23/19 137 lb (62.1 kg)  09/17/19 136 lb 3.2 oz (61.8 kg)  07/02/19 139 lb (63 kg)    Ocular: Sclerae unicteric, pupils equal, round and reactive to light Ear-nose-throat: Oropharynx clear, dentition fair Lymphatic: No cervical or supraclavicular adenopathy Lungs no rales or rhonchi, good excursion bilaterally Heart regular rate and rhythm, no murmur  appreciated Abd soft, nontender, positive bowel sounds, no liver or spleen tip palpated on exam, no fluid wave  MSK no focal spinal tenderness, no joint edema Neuro: non-focal, well-oriented, appropriate affect Breasts: Deferred   Lab Results  Component Value Date   WBC 4.2 10/16/2019   HGB 13.1 10/16/2019   HCT 40.6 10/16/2019   MCV 88.5 10/16/2019   PLT 271 10/16/2019   Lab Results  Component Value Date   FERRITIN 126 07/02/2019   IRON 105 07/02/2019   TIBC 236 07/02/2019   UIBC 131 07/02/2019   IRONPCTSAT 44 07/02/2019   Lab Results  Component Value Date   RETICCTPCT 1.2 10/16/2019   RBC 4.59 10/16/2019   RBC 4.57 10/16/2019   No results found for: KPAFRELGTCHN, LAMBDASER, KAPLAMBRATIO No results found for: IGGSERUM, IGA, IGMSERUM No results found for: Odetta Pink, SPEI   Chemistry      Component Value Date/Time   NA 140 05/18/2019 0905   NA 138 04/13/2019 1010   K 3.4 (L) 05/18/2019 0905   CL 106 05/18/2019 0905   CO2 24 05/18/2019 0905   BUN 9 05/18/2019 0905   BUN 5 (L) 04/13/2019 1010   CREATININE 0.69 05/18/2019 0905      Component Value Date/Time   CALCIUM 9.1 05/18/2019 0905   ALKPHOS 53 05/18/2019 0905   AST 12 (L) 05/18/2019 0905   ALT 7 05/18/2019 0905   BILITOT 0.4  05/18/2019 0905       Impression and Plan: Faith Huff is a very pleasant 24 yo African American female with iron deficiency anemia secondary to heavy cycles. She is doing well and has had a nice response to IV iron. Her cycles has reduced to spotting on Depo which has also helped her anemia.  We will see what her counts look like and replace if needed.   We will go ahead and plan to see her again in another 6 weeks.  She will contact our office with any questions or concerns. We can certainly see her sooner if needed.   Faith Gins, NP 4/2/20211:06 PM

## 2019-10-16 NOTE — Telephone Encounter (Signed)
Called and spoke with patient regarding appointments scheduled she was ok with both date/time per 4/2 los

## 2019-10-19 LAB — IRON AND TIBC
Iron: 51 ug/dL (ref 41–142)
Saturation Ratios: 19 % — ABNORMAL LOW (ref 21–57)
TIBC: 268 ug/dL (ref 236–444)
UIBC: 217 ug/dL (ref 120–384)

## 2019-10-19 LAB — FERRITIN: Ferritin: 41 ng/mL (ref 11–307)

## 2019-10-23 ENCOUNTER — Other Ambulatory Visit: Payer: Self-pay

## 2019-10-23 ENCOUNTER — Inpatient Hospital Stay: Payer: 59

## 2019-10-23 NOTE — Progress Notes (Signed)
Left prior to treatment due to awaiting on financial assistance and PA. Nothing done beside VS.

## 2019-10-27 ENCOUNTER — Other Ambulatory Visit: Payer: Self-pay | Admitting: *Deleted

## 2019-10-28 ENCOUNTER — Telehealth: Payer: Self-pay | Admitting: *Deleted

## 2019-10-28 NOTE — Telephone Encounter (Signed)
Call received from [facility] with [facility] Financial Assistance [phone] to inform office that they have reached out to patient twice and have not heard back from patient and can only call pt three times.  Call placed to patient and voicemail is full.  Call placed to patient's father and message left for pt to call office regarding Feraheme.

## 2019-11-03 ENCOUNTER — Encounter: Payer: Self-pay | Admitting: Pharmacy Technician

## 2019-11-03 ENCOUNTER — Other Ambulatory Visit: Payer: Self-pay

## 2019-11-03 ENCOUNTER — Other Ambulatory Visit: Payer: Self-pay | Admitting: Obstetrics and Gynecology

## 2019-11-03 ENCOUNTER — Ambulatory Visit
Admission: RE | Admit: 2019-11-03 | Discharge: 2019-11-03 | Disposition: A | Discharge status: Home / Self Care | Payer: BC Managed Care – PPO | Source: Ambulatory Visit | Attending: Obstetrics and Gynecology | Admitting: Obstetrics and Gynecology

## 2019-11-03 DIAGNOSIS — N6310 Unspecified lump in the right breast, unspecified quadrant: Secondary | ICD-10-CM

## 2019-11-03 NOTE — Progress Notes (Signed)
Patient has been approved for drug assistance by [employer] for Feraheme. The enrollment period is from 10/30/19-10/29/20 based on self pay. First DOS covered is to be scheduled.

## 2019-12-07 ENCOUNTER — Telehealth: Payer: Self-pay | Admitting: *Deleted

## 2019-12-07 ENCOUNTER — Other Ambulatory Visit: Payer: Self-pay

## 2019-12-07 ENCOUNTER — Ambulatory Visit (INDEPENDENT_AMBULATORY_CARE_PROVIDER_SITE_OTHER): Payer: 59

## 2019-12-07 VITALS — BP 100/60 | HR 80 | Temp 97.9°F | Ht 64.0 in | Wt 138.0 lb

## 2019-12-07 DIAGNOSIS — Z3049 Encounter for surveillance of other contraceptives: Secondary | ICD-10-CM | POA: Diagnosis not present

## 2019-12-07 MED ORDER — MEDROXYPROGESTERONE ACETATE 150 MG/ML IM SUSP
150.0000 mg | Freq: Once | INTRAMUSCULAR | Status: AC
  Administered 2019-12-07: 150 mg via INTRAMUSCULAR

## 2019-12-07 NOTE — Telephone Encounter (Signed)
Patient in office for depo-provera injection. Patient reports she started her menses on 11/02/19 and is still bleeding. Reports increased bleeding last week after intercourse, was changing saturated pad q30 min to 1 hrs, with fatigue and dizziness. Patient reports she is changing a non-saturated pad q3-4 hrs today. Denies lightheadedness, dizziness, weakness, fatigue, SHOB.   Patient has a Hx of iron deficiency anemia, cancelled her last iron infusion due to insurance.   Patient states she "just wants to switch to something to stop her menses". Offered to hold off on depo provera and schedule OV with Dr. Oscar La to discuss possible alternatives, patient declined, lives and travels from [place].  Patient states she wants to wait until next depo provera is due to discuss possible alternatives.   Reviewed with Dr. Edward Jolly. Advised ok to proceed with depo-provera as scheduled. Instructed patient to f/u with hematology to reschedule iron infusion as recommended. Return call to office if she desires to discuss alternative contraceptive. ER precautions reviewed for heavy bleeding or new symptoms -such as lightheadedness, dizziness, weakness, SHOB. Patient verbalizes understanding.   Routing to provider for final review. Patient is agreeable to disposition. Will close encounter.   Cc: Dr. Oscar La

## 2019-12-07 NOTE — Progress Notes (Signed)
Patient is here for Depo Provera Injection Patient is within Depo Provera Calender Limits yes Next Depo Due between: 8/9-8/23 Last AEX: 04/03/19 JJ AEX Scheduled: 04/18/20  Patient is aware when next depo is due  Pt tolerated Injection well in RUOQ.  Routed to provider for review, encounter closed.

## 2019-12-15 ENCOUNTER — Telehealth: Payer: Self-pay

## 2019-12-15 DIAGNOSIS — N920 Excessive and frequent menstruation with regular cycle: Secondary | ICD-10-CM

## 2019-12-15 DIAGNOSIS — Z862 Personal history of diseases of the blood and blood-forming organs and certain disorders involving the immune mechanism: Secondary | ICD-10-CM

## 2019-12-15 NOTE — Telephone Encounter (Signed)
Patient is calling in regards to depo update. Patient was seen (12/07/19) for depo and patient states she started to bleed heavy a few days ago.

## 2019-12-15 NOTE — Telephone Encounter (Signed)
Call to patient, no answer, mailbox full, unable to leave message. No alternative number.

## 2019-12-15 NOTE — Telephone Encounter (Signed)
Spoke with patient. Received depo provera injection on 12/07/19, menses had almost stopped at that time. Reports bleeding increased on 12/13/19.Initially was changing saturated overnight pad q2 hours, is now changing q3 hours. Several small clots. Mild menses like cramps, 2/10. Denies SHOB, weakness, dizziness, lightheadedness, headache or fatigue.   Reviewed 09/23/19 OV notes Letter mailed on 10/15/19 recommending PUS.   Recommended patient proceed with PUS for further evaluation, patient agreeable. PUS scheduled for 12/22/19 at 1:30pm, consult to follow at 2pm with Dr. Oscar La. Precautions reviewed with patient, continue to monitor bleeding, return call to office if bleeding becomes heavy or new symptoms develop. ER/Urgent Care if after hours. Advised will update Dr. Oscar La, our office will return call if any additional recommendations. Patient is agreeable.   Order placed for PUS  Routing to Dr. Oscar La for final review.   Cc: Hayley Carder

## 2019-12-16 NOTE — Telephone Encounter (Signed)
Patient returned call

## 2019-12-16 NOTE — Telephone Encounter (Signed)
Spoke with patient regarding benefits for recommended ultrasound. Patient is aware that ultrasound is transvaginal. Patient acknowledges understanding of information presented. Patient is aware of cancellation policy.  

## 2019-12-22 ENCOUNTER — Other Ambulatory Visit: Payer: Self-pay

## 2019-12-22 ENCOUNTER — Other Ambulatory Visit: Payer: Self-pay | Admitting: Obstetrics and Gynecology

## 2019-12-28 ENCOUNTER — Inpatient Hospital Stay: Payer: 59 | Attending: Family

## 2019-12-28 ENCOUNTER — Other Ambulatory Visit: Payer: Self-pay

## 2019-12-28 DIAGNOSIS — D500 Iron deficiency anemia secondary to blood loss (chronic): Secondary | ICD-10-CM | POA: Insufficient documentation

## 2019-12-28 DIAGNOSIS — N921 Excessive and frequent menstruation with irregular cycle: Secondary | ICD-10-CM | POA: Diagnosis not present

## 2019-12-28 LAB — CBC WITH DIFFERENTIAL (CANCER CENTER ONLY)
Abs Immature Granulocytes: 0.01 10*3/uL (ref 0.00–0.07)
Basophils Absolute: 0.1 10*3/uL (ref 0.0–0.1)
Basophils Relative: 2 %
Eosinophils Absolute: 0.2 10*3/uL (ref 0.0–0.5)
Eosinophils Relative: 4 %
HCT: 29.7 % — ABNORMAL LOW (ref 36.0–46.0)
Hemoglobin: 9.4 g/dL — ABNORMAL LOW (ref 12.0–15.0)
Immature Granulocytes: 0 %
Lymphocytes Relative: 40 %
Lymphs Abs: 1.9 10*3/uL (ref 0.7–4.0)
MCH: 27.1 pg (ref 26.0–34.0)
MCHC: 31.6 g/dL (ref 30.0–36.0)
MCV: 85.6 fL (ref 80.0–100.0)
Monocytes Absolute: 0.6 10*3/uL (ref 0.1–1.0)
Monocytes Relative: 12 %
Neutro Abs: 2.1 10*3/uL (ref 1.7–7.7)
Neutrophils Relative %: 42 %
Platelet Count: 347 10*3/uL (ref 150–400)
RBC: 3.47 MIL/uL — ABNORMAL LOW (ref 3.87–5.11)
RDW: 12.2 % (ref 11.5–15.5)
WBC Count: 4.8 10*3/uL (ref 4.0–10.5)
nRBC: 0 % (ref 0.0–0.2)

## 2019-12-28 LAB — RETICULOCYTES
Immature Retic Fract: 20 % — ABNORMAL HIGH (ref 2.3–15.9)
RBC.: 3.4 MIL/uL — ABNORMAL LOW (ref 3.87–5.11)
Retic Count, Absolute: 58 10*3/uL (ref 19.0–186.0)
Retic Ct Pct: 1.7 % (ref 0.4–3.1)

## 2019-12-29 ENCOUNTER — Ambulatory Visit (INDEPENDENT_AMBULATORY_CARE_PROVIDER_SITE_OTHER): Payer: 59

## 2019-12-29 ENCOUNTER — Ambulatory Visit (INDEPENDENT_AMBULATORY_CARE_PROVIDER_SITE_OTHER): Payer: Self-pay | Admitting: Obstetrics and Gynecology

## 2019-12-29 ENCOUNTER — Encounter: Payer: Self-pay | Admitting: Obstetrics and Gynecology

## 2019-12-29 VITALS — BP 100/64 | HR 59 | Temp 98.2°F | Ht 64.0 in | Wt 139.0 lb

## 2019-12-29 DIAGNOSIS — N921 Excessive and frequent menstruation with irregular cycle: Secondary | ICD-10-CM | POA: Diagnosis not present

## 2019-12-29 DIAGNOSIS — D500 Iron deficiency anemia secondary to blood loss (chronic): Secondary | ICD-10-CM

## 2019-12-29 DIAGNOSIS — G43109 Migraine with aura, not intractable, without status migrainosus: Secondary | ICD-10-CM | POA: Insufficient documentation

## 2019-12-29 DIAGNOSIS — N939 Abnormal uterine and vaginal bleeding, unspecified: Secondary | ICD-10-CM

## 2019-12-29 DIAGNOSIS — Z862 Personal history of diseases of the blood and blood-forming organs and certain disorders involving the immune mechanism: Secondary | ICD-10-CM | POA: Diagnosis not present

## 2019-12-29 DIAGNOSIS — N920 Excessive and frequent menstruation with regular cycle: Secondary | ICD-10-CM

## 2019-12-29 LAB — IRON AND TIBC
Iron: 15 ug/dL — ABNORMAL LOW (ref 41–142)
Saturation Ratios: 4 % — ABNORMAL LOW (ref 21–57)
TIBC: 355 ug/dL (ref 236–444)
UIBC: 340 ug/dL (ref 120–384)

## 2019-12-29 LAB — FERRITIN: Ferritin: 3 ng/mL — ABNORMAL LOW (ref 11–307)

## 2019-12-29 MED ORDER — TRANEXAMIC ACID 650 MG PO TABS: 1300.0000 mg | ORAL_TABLET | Freq: Three times a day (TID) | ORAL | 2 refills | Status: DC

## 2019-12-29 NOTE — Progress Notes (Signed)
GYNECOLOGY  VISIT   HPI: 24 y.o.   Single Black or African American Not Hispanic or Latino  female   [id number] with Patient's last menstrual period was 11/03/2019.   here for ultrasound consult. She has a h/o menorrhagia leading to severe anemia, currently corrected on depo-provera. 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  She has been bleeding since 11/03/19. She got her last depo shot ~2 weeks ago, getting lighter. At times she would saturate a night time pad in an hour. That heavy for two weeks. She has been fatigued for the last week, getting a little better.  Hgb was 9.4 yesterday. She has been on the depo provera since ~9/20.   GYNECOLOGIC HISTORY: Patient's last menstrual period was 11/03/2019. Contraception: Depo  Menopausal hormone therapy: None         OB History    Gravida  0   Para  0   Term  0   Preterm  0   AB  0   Living  0     SAB  0   TAB  0   Ectopic  0   Multiple  0   Live Births                 Patient Active Problem List   Diagnosis Date Noted  . Iron deficiency anemia due to chronic blood loss 01/24/2016  . 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 Anemia, iron deficiency 07/23/2013  . Menorrhagia 07/23/2013  She reports a h/o migraine with aura  Past Medical History:  Diagnosis Date  . Allergic rhinitis   . Anemia   . History of reactive airway disease   . Migraine with aura    couple of episodes.     Past Surgical History:  Procedure Laterality Date  . LASIK    . MOUTH SURGERY    . WISDOM TOOTH EXTRACTION      Current Outpatient Medications  Medication Sig Dispense Refill  . medroxyPROGESTERone (DEPO-PROVERA) 150 MG/ML injection Inject 150 mg into the muscle every 3 (three) months.     No current facility-administered medications for this visit.     ALLERGIES: Patient has no known allergies.  Family History  Problem Relation Age of Onset  . Anemia Mother   . Diabetes Father   . Hypertension Father     Social History    Socioeconomic History  . Marital status: Single    Spouse name: Not on file  . Number of children: Not on file  . Years of education: Not on file  . Highest education level: Not on file  Occupational History  . Not on file  Tobacco Use  . Smoking status: Never Smoker  . Smokeless tobacco: Never Used  Vaping Use  . Vaping Use: Never used  Substance and Sexual Activity  . Alcohol use: Yes    Alcohol/week: 0.0 standard drinks    Comment: occasional  . Drug use: Never  . Sexual activity: Yes    Birth control/protection: None  Other Topics Concern  . Not on file  Social History Narrative  . Not on file   Social Determinants of Health   Financial Resource Strain:   . Difficulty of Paying Living Expenses:   Food Insecurity:   . Worried About Programme researcher, broadcasting/film/video in the Last Year:   . Barista in the Last Year:   Transportation Needs:   . Freight forwarder (Medical):   Marland Kitchen Lack of Transportation (Non-Medical):  Physical Activity:   . Days of Exercise per Week:   . Minutes of Exercise per Session:   Stress:   . Feeling of Stress :   Social Connections:   . Frequency of Communication with Friends and Family:   . Frequency of Social Gatherings with Friends and Family:   . Attends Religious Services:   . Active Member of Clubs or Organizations:   . Attends Archivist Meetings:   Marland Kitchen Marital Status:   Intimate Partner Violence:   . Fear of Current or Ex-Partner:   . Emotionally Abused:   Marland Kitchen Physically Abused:   . Sexually Abused:     Review of Systems  All other systems reviewed and are negative.   PHYSICAL EXAMINATION:    BP 100/64   Pulse (!) 59   Temp 98.2 F (36.8 C)   Ht 5\' 4"  (1.626 m)   Wt 139 lb (63 kg)   LMP 11/03/2019   SpO2 100%   BMI 23.86 kg/m     General appearance: alert, cooperative and appears stated age  Reviewed ultrasound images with the patient  Labs from Hematology yesterday with Hgb of 9.4 with low iron stores.    ASSESSMENT H/O menorrhagia leading to severe anemia prior to depo provera. Anemia had improved on depo provera, but she is anemic again.  BTB on depo-provera, very heavy at times. Ultrasound with blood and clot in her uterine cavity, thin stripe, small subserosal myoma    PLAN Not a candidate for OCP's Discussed possible transdermal estrogen, typically with migraines with aura a low dose, 0.05 mg patch is recommended. With BTB typically a 0.1 mg patch is recommended Reviewed lysteda Will use lysteda only for 5 days with episode of very heavy bleeding, not to use more than 5 days a month. Discussed potential risk of clotting.  F/u with hematology for iron transfusion    In addition to reviewing the ultrasound images, over 20 minutes was spent in management options

## 2019-12-29 NOTE — Patient Instructions (Addendum)
Tranexamic acid oral tablets What is this medicine? TRANEXAMIC ACID (TRAN ex AM ik AS id) slows down or stops blood clots from being broken down. This medicine is used to treat heavy monthly menstrual bleeding. This medicine may be used for other purposes; ask your health care provider or pharmacist if you have questions. COMMON BRAND NAME(S): Cyklokapron, Lysteda What should I tell my health care provider before I take this medicine? They need to know if you have any of these conditions:  bleeding in the brain  blood clotting problems  kidney disease  vision problems  an unusual allergic reaction to tranexamic acid, other medicines, foods, dyes, or preservatives  pregnant or trying to get pregnant  breast-feeding How should I use this medicine? Take this medicine by mouth with a glass of water. Follow the directions on the prescription label. Do not cut, crush, or chew this medicine. You can take it with or without food. If it upsets your stomach, take it with food. Take your medicine at regular intervals. Do not take it more often than directed. Do not stop taking except on your doctor's advice. Do not take this medicine until your period has started. Do not take it for more than 5 days in a row. Do not take this medicine when you do not have your period. Talk to your pediatrician regarding the use of this medicine in children. While this drug may be prescribed for female children as young as 12 years of age for selected conditions, precautions do apply. Overdosage: If you think you have taken too much of this medicine contact a poison control center or emergency room at once. NOTE: This medicine is only for you. Do not share this medicine with others. What if I miss a dose? If you miss a dose, take it when you remember, and then take your next dose at least 6 hours later. Do not take more than 2 tablets at a time to make up for missed doses. What may interact with this medicine? Do  not take this medicine with any of the following medications:  estrogens  birth control pills, patches, injections, rings or other devices that contain both an estrogen and a progestin This medicine may also interact with the following medications:  certain medicines used to help your blood clot  tretinoin (taken by mouth) This list may not describe all possible interactions. Give your health care provider a list of all the medicines, herbs, non-prescription drugs, or dietary supplements you use. Also tell them if you smoke, drink alcohol, or use illegal drugs. Some items may interact with your medicine. What should I watch for while using this medicine? Tell your doctor or healthcare professional if your symptoms do not start to get better or if they get worse. Tell your doctor or healthcare professional if you notice any eye problems while taking this medicine. Your doctor will refer you to an eye doctor who will examine your eyes. What side effects may I notice from receiving this medicine? Side effects that you should report to your doctor or health care professional as soon as possible:  allergic reactions like skin rash, itching or hives, swelling of the face, lips, or tongue  breathing difficulties  changes in vision  sudden or severe pain in the chest, legs, head, or groin  unusually weak or tired Side effects that usually do not require medical attention (report to your doctor or health care professional if they continue or are bothersome):  back pain    headache  muscle or joint aches  sinus and nasal problems  stomach pain  tiredness This list may not describe all possible side effects. Call your doctor for medical advice about side effects. You may report side effects to FDA at 1-800-FDA-1088. Where should I keep my medicine? Keep out of the reach of children. Store at room temperature between 15 and 30 degrees C (59 and 86 degrees F). Throw away any unused  medicine after the expiration date. NOTE: This sheet is a summary. It may not cover all possible information. If you have questions about this medicine, talk to your doctor, pharmacist, or health care provider.  2020 Elsevier/Gold Standard (2015-08-04 09:12:15)  

## 2019-12-30 ENCOUNTER — Other Ambulatory Visit: Payer: Self-pay | Admitting: Family

## 2019-12-30 ENCOUNTER — Telehealth: Payer: Self-pay | Admitting: Hematology & Oncology

## 2019-12-30 NOTE — Telephone Encounter (Signed)
Called and spoke with patient regarding iron infusion appointment being added.  She agreed to both dates/times per 6/16 sch msg

## 2020-01-01 ENCOUNTER — Other Ambulatory Visit: Payer: Self-pay

## 2020-01-01 ENCOUNTER — Inpatient Hospital Stay: Payer: 59

## 2020-01-01 VITALS — BP 100/60 | HR 80 | Temp 97.5°F | Resp 16

## 2020-01-01 DIAGNOSIS — D509 Iron deficiency anemia, unspecified: Secondary | ICD-10-CM

## 2020-01-01 DIAGNOSIS — N921 Excessive and frequent menstruation with irregular cycle: Secondary | ICD-10-CM | POA: Diagnosis not present

## 2020-01-01 MED ORDER — SODIUM CHLORIDE 0.9 % IV SOLN
INTRAVENOUS | Status: DC
  Filled 2020-01-01: qty 250

## 2020-01-01 MED ORDER — SODIUM CHLORIDE 0.9 % IV SOLN
510.0000 mg | Freq: Once | INTRAVENOUS | Status: AC
  Administered 2020-01-01: 510 mg via INTRAVENOUS
  Filled 2020-01-01: qty 17

## 2020-01-01 NOTE — Patient Instructions (Signed)

## 2020-01-08 ENCOUNTER — Inpatient Hospital Stay: Payer: 59

## 2020-01-08 ENCOUNTER — Other Ambulatory Visit: Payer: Self-pay

## 2020-01-08 VITALS — BP 100/55 | HR 79 | Temp 97.7°F | Resp 17

## 2020-01-08 DIAGNOSIS — D509 Iron deficiency anemia, unspecified: Secondary | ICD-10-CM

## 2020-01-08 DIAGNOSIS — N921 Excessive and frequent menstruation with irregular cycle: Secondary | ICD-10-CM | POA: Diagnosis not present

## 2020-01-08 MED ORDER — SODIUM CHLORIDE 0.9 % IV SOLN
1000.0000 mL | INTRAVENOUS | Status: AC
  Filled 2020-01-08: qty 1000

## 2020-01-08 MED ORDER — SODIUM CHLORIDE 0.9 % IV SOLN
510.0000 mg | Freq: Once | INTRAVENOUS | Status: AC
  Administered 2020-01-08: 510 mg via INTRAVENOUS
  Filled 2020-01-08: qty 17

## 2020-01-08 NOTE — Progress Notes (Signed)
Pt declined to stay for post infusion observation period. Pt stated she has tolerated medication prior without difficulty. Pt aware to call clinic with any questions or concerns. Pt verbalized understanding and had no further questions. 

## 2020-01-08 NOTE — Patient Instructions (Signed)

## 2020-02-22 ENCOUNTER — Ambulatory Visit: Payer: Self-pay

## 2020-02-22 NOTE — Progress Notes (Deleted)
Patient is here for Depo Provera Injection Patient is within Depo Provera Calender Limits 8/9-8/23 Next Depo Due between: 10/25-11/8 Last AEX: 04-13-2019 AEX Scheduled: 04-18-2020  Patient is aware when next depo is due  Pt tolerated Injection well in LUOQ.  Routed to provider for review, encounter closed.

## 2020-02-22 NOTE — Progress Notes (Deleted)
Patient is here for Depo Provera Injection Patient is within Depo Provera Calender Limits 8/9-8/23 Next Depo Due between: 10/25-11/8 Last AEX: 04-13-2019 AEX Scheduled: 04-18-2020 with Oscar La  Patient is aware when next depo is due  Pt tolerated Injection well in LUOQ.  Routed to provider for review, encounter closed.

## 2020-02-23 ENCOUNTER — Ambulatory Visit: Payer: Self-pay

## 2020-02-23 ENCOUNTER — Telehealth: Payer: Self-pay | Admitting: Obstetrics and Gynecology

## 2020-02-23 NOTE — Telephone Encounter (Signed)
Patient canceled her depo provera appointment today and did not wish to reschedule.

## 2020-03-14 ENCOUNTER — Telehealth: Payer: Self-pay

## 2020-03-14 NOTE — Telephone Encounter (Signed)
AEX 04/13/2019 H/o AUB H/o Menorrhagia leading to anemia H/o Iron def.Anemia- had iron infusions on 01/01/20, 01/08/20  Spoke with pt. Pt states cancelled 02/23/20 Depo injection due to stating still bleeding. Pt states would like to stop using Depo and would like to discuss other birth control options. Pt had iron infusions through [facility] by Dr Myna Hidalgo in June. Pt states not SA and no concern with pregnancy. Depo was due 8/9-8/23/21. Denies heavy bleeding or clots at this time, only spotting.   Advised pt to have OV for discussion. Pt agreeable. Pt scheduled with Dr Oscar La on 03/15/20 at 2 pm. Pt agreeable and verbalized understanding to date and time of appt.  Encounter closed.

## 2020-03-14 NOTE — Telephone Encounter (Signed)
Patient called to reschedule Depo shot. Patient has missed time frame.

## 2020-03-15 ENCOUNTER — Encounter: Payer: Self-pay | Admitting: Obstetrics and Gynecology

## 2020-03-15 ENCOUNTER — Ambulatory Visit (INDEPENDENT_AMBULATORY_CARE_PROVIDER_SITE_OTHER): Payer: 59 | Admitting: Obstetrics and Gynecology

## 2020-03-15 ENCOUNTER — Other Ambulatory Visit: Payer: Self-pay

## 2020-03-15 VITALS — BP 118/76 | HR 104 | Ht 64.0 in | Wt 132.8 lb

## 2020-03-15 DIAGNOSIS — F419 Anxiety disorder, unspecified: Secondary | ICD-10-CM

## 2020-03-15 DIAGNOSIS — N921 Excessive and frequent menstruation with irregular cycle: Secondary | ICD-10-CM

## 2020-03-15 DIAGNOSIS — Z862 Personal history of diseases of the blood and blood-forming organs and certain disorders involving the immune mechanism: Secondary | ICD-10-CM

## 2020-03-15 DIAGNOSIS — R4586 Emotional lability: Secondary | ICD-10-CM

## 2020-03-15 LAB — POCT URINE PREGNANCY: Preg Test, Ur: NEGATIVE

## 2020-03-15 MED ORDER — NORETHINDRONE ACETATE 5 MG PO TABS
ORAL_TABLET | ORAL | 1 refills | Status: AC
Start: 2020-03-15 — End: ?

## 2020-03-15 MED ORDER — CITALOPRAM HYDROBROMIDE 20 MG PO TABS: ORAL_TABLET | ORAL | 1 refills | Status: AC

## 2020-03-15 NOTE — Progress Notes (Signed)
GYNECOLOGY  VISIT   HPI: 24 y.o.   Single Black or African American Not Hispanic or Latino  female   G0P0000 with No LMP recorded. Patient has had an injection.   here for irregular bleeding. She says that she has been bleeding since the beginning of august. She did not receive her last depo due to this irregular bleeding.     The patient has a h/o menorrhagia leading to severe anemia. She was started on depo provera on 04/13/19. She was having bleeding on the depo provera. In 6/21 her hgb was 9.4. Her last depo-provera shot was 12/07/19. She doesn't want to keep taking it.  Ultrasound in 6/21 with thin stripe and small subserosal myoma.  She was referred to hematology in 6/21, canceled the appointment.   Since June she has only not about 10 days total. Most of that time her bleeding was light to moderate. A week ago her bleeding got heavy, she was out of town and didn't have the lysteda she was prescribed. She was saturating a pad in up to 40 minutes for up to 2 days. She had 2 episodes of syncope during that time. Currently feeling better. She is going through a pad every 3-4 hour. She is getting headaches and is feeling light headed.  She has an appointment with hematology next week.  She hasn't been sexually active since June.   She isn't a candidate for OCP's, h/o migraines with aura.  Her brother and his wife had covid last month and she had to help with the kids. Felt very stressed.  She is feeling anxious, has had some panic attacks. Mood is down. She has some support. Struggling to focus and stay calm. Feels like she is dreaming sometimes.   GYNECOLOGIC HISTORY: No LMP recorded. Patient has had an injection. Contraception: none  Menopausal hormone therapy: none        OB History    Gravida  0   Para  0   Term  0   Preterm  0   AB  0   Living  0     SAB  0   TAB  0   Ectopic  0   Multiple  0   Live Births                 Patient Active Problem List    Diagnosis Date Noted  . Migraine with aura   . Iron deficiency anemia due to chronic blood loss 01/24/2016  . 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 Anemia, iron deficiency 07/23/2013  . Menorrhagia 07/23/2013    Past Medical History:  Diagnosis Date  . Allergic rhinitis   . Anemia   . History of reactive airway disease   . Migraine with aura    couple of episodes.   . Migraine with aura     Past Surgical History:  Procedure Laterality Date  . LASIK    . MOUTH SURGERY    . WISDOM TOOTH EXTRACTION      Current Outpatient Medications  Medication Sig Dispense Refill  . Ferrous Sulfate (IRON PO) Take 45 mg by mouth.     No current facility-administered medications for this visit.     ALLERGIES: Patient has no known allergies.  Family History  Problem Relation Age of Onset  . Anemia Mother   . Diabetes Father   . Hypertension Father     Social History   Socioeconomic History  . Marital status: Single  Spouse name: Not on file  . Number of children: Not on file  . Years of education: Not on file  . Highest education level: Not on file  Occupational History  . Not on file  Tobacco Use  . Smoking status: Never Smoker  . Smokeless tobacco: Never Used  Vaping Use  . Vaping Use: Never used  Substance and Sexual Activity  . Alcohol use: Yes    Alcohol/week: 0.0 standard drinks    Comment: occasional  . Drug use: Never  . Sexual activity: Yes    Birth control/protection: None  Other Topics Concern  . Not on file  Social History Narrative  . Not on file   Social Determinants of Health   Financial Resource Strain:   . Difficulty of Paying Living Expenses: Not on file  Food Insecurity:   . Worried About Programme researcher, broadcasting/film/video in the Last Year: Not on file  . Ran Out of Food in the Last Year: Not on file  Transportation Needs:   . Lack of Transportation (Medical): Not on file  . Lack of Transportation (Non-Medical): Not on file  Physical Activity:   . Days of  Exercise per Week: Not on file  . Minutes of Exercise per Session: Not on file  Stress:   . Feeling of Stress : Not on file  Social Connections:   . Frequency of Communication with Friends and Family: Not on file  . Frequency of Social Gatherings with Friends and Family: Not on file  . Attends Religious Services: Not on file  . Active Member of Clubs or Organizations: Not on file  . Attends Banker Meetings: Not on file  . Marital Status: Not on file  Intimate Partner Violence:   . Fear of Current or Ex-Partner: Not on file  . Emotionally Abused: Not on file  . Physically Abused: Not on file  . Sexually Abused: Not on file    Review of Systems  All other systems reviewed and are negative.   PHYSICAL EXAMINATION:    BP 118/76   Pulse (!) 104   Ht 5\' 4"  (1.626 m)   Wt 132 lb 12.8 oz (60.2 kg)   SpO2 99%   BMI 22.80 kg/m     General appearance: alert, cooperative and appears stated age Neck: no adenopathy, supple, symmetrical, trachea midline and thyroid normal to inspection and palpation Heart: regular rate and rhythm Lungs: CTAB Abdomen: soft, non-tender; bowel sounds normal; no masses,  no organomegaly Extremities: normal, atraumatic, no cyanosis Skin: normal color, texture and turgor, no rashes or lesions Lymph: normal cervical supraclavicular and inguinal nodes Neurologic: grossly normal   ASSESSMENT Menometrorrhagia, worsening as she is coming off of Depo-Provera Normal ultrasound other than a small subserosal myoma Can't take OCP's Anxiety and mood changes    PLAN Hgb now is 8.4 gm/dl CBC, Ferritin, TSH UPT Start aygestin TID, decrease to BID and then qd as tolerated Start Celexa Name of counselor given She has an appointment with Hematology next week Given information on the mirena IUD She has lysteda at home, hasn't tried it yet (was out of town with her very heavy bleeding).    An After Visit Summary was printed and given to the  patient.   Over 30 minutes spent in total patient care

## 2020-03-15 NOTE — Patient Instructions (Addendum)
Therapist: Roosevelt Locks, [phone]  Managing Anxiety, Adult After being diagnosed with an anxiety disorder, you may be relieved to know why you have felt or behaved a certain way. You may also feel overwhelmed about the treatment ahead and what it will mean for your life. With care and support, you can manage this condition and recover from it. How to manage lifestyle changes Managing stress and anxiety  Stress is your body's reaction to life changes and events, both good and bad. Most stress will last just a few hours, but stress can be ongoing and can lead to more than just stress. Although stress can play a major role in anxiety, it is not the same as anxiety. Stress is usually caused by something external, such as a deadline, test, or competition. Stress normally passes after the triggering event has ended.  Anxiety is caused by something internal, such as imagining a terrible outcome or worrying that something will go wrong that will devastate you. Anxiety often does not go away even after the triggering event is over, and it can become long-term (chronic) worry. It is important to understand the differences between stress and anxiety and to manage your stress effectively so that it does not lead to an anxious response. Talk with your health care provider or a counselor to learn more about reducing anxiety and stress. He or she may suggest tension reduction techniques, such as:  Music therapy. This can include creating or listening to music that you enjoy and that inspires you.  Mindfulness-based meditation. This involves being aware of your normal breaths while not trying to control your breathing. It can be done while sitting or walking.  Centering prayer. This involves focusing on a word, phrase, or sacred image that means something to you and brings you peace.  Deep breathing. To do this, expand your stomach and inhale slowly through your nose. Hold your breath for 3-5 seconds. Then  exhale slowly, letting your stomach muscles relax.  Self-talk. This involves identifying thought patterns that lead to anxiety reactions and changing those patterns.  Muscle relaxation. This involves tensing muscles and then relaxing them. Choose a tension reduction technique that suits your lifestyle and personality. These techniques take time and practice. Set aside 5-15 minutes a day to do them. Therapists can offer counseling and training in these techniques. The training to help with anxiety may be covered by some insurance plans. Other things you can do to manage stress and anxiety include:  Keeping a stress/anxiety diary. This can help you learn what triggers your reaction and then learn ways to manage your response.  Thinking about how you react to certain situations. You may not be able to control everything, but you can control your response.  Making time for activities that help you relax and not feeling guilty about spending your time in this way.  Visual imagery and yoga can help you stay calm and relax.  Medicines Medicines can help ease symptoms. Medicines for anxiety include:  Anti-anxiety drugs.  Antidepressants. Medicines are often used as a primary treatment for anxiety disorder. Medicines will be prescribed by a health care provider. When used together, medicines, psychotherapy, and tension reduction techniques may be the most effective treatment. Relationships Relationships can play a big part in helping you recover. Try to spend more time connecting with trusted friends and family members. Consider going to couples counseling, taking family education classes, or going to family therapy. Therapy can help you and others better understand your condition. How  to recognize changes in your anxiety Everyone responds differently to treatment for anxiety. Recovery from anxiety happens when symptoms decrease and stop interfering with your daily activities at home or work. This  may mean that you will start to:  Have better concentration and focus. Worry will interfere less in your daily thinking.  Sleep better.  Be less irritable.  Have more energy.  Have improved memory. It is important to recognize when your condition is getting worse. Contact your health care provider if your symptoms interfere with home or work and you feel like your condition is not improving. Follow these instructions at home: Activity  Exercise. Most adults should do the following: ? Exercise for at least 150 minutes each week. The exercise should increase your heart rate and make you sweat (moderate-intensity exercise). ? Strengthening exercises at least twice a week.  Get the right amount and quality of sleep. Most adults need 7-9 hours of sleep each night. Lifestyle   Eat a healthy diet that includes plenty of vegetables, fruits, whole grains, low-fat dairy products, and lean protein. Do not eat a lot of foods that are high in solid fats, added sugars, or salt.  Make choices that simplify your life.  Do not use any products that contain nicotine or tobacco, such as cigarettes, e-cigarettes, and chewing tobacco. If you need help quitting, ask your health care provider.  Avoid caffeine, alcohol, and certain over-the-counter cold medicines. These may make you feel worse. Ask your pharmacist which medicines to avoid. General instructions  Take over-the-counter and prescription medicines only as told by your health care provider.  Keep all follow-up visits as told by your health care provider. This is important. Where to find support You can get help and support from these sources:  Self-help groups.  Online and [employer].  A trusted spiritual leader.  Couples counseling.  Family education classes.  Family therapy. Where to find more information You may find that joining a support group helps you deal with your anxiety. The following sources can help you  locate counselors or support groups near you:  Mental Health America: www.mentalhealthamerica.net  Anxiety and Depression Association of [place] (ADAA): ProgramCam.de  [employer] Mental [employer]ness (NAMI): www.nami.org Contact a health care provider if you:  Have a hard time staying focused or finishing daily tasks.  Spend many hours a day feeling worried about everyday life.  Become exhausted by worry.  Start to have headaches, feel tense, or have nausea.  Urinate more than normal.  Have diarrhea. Get help right away if you have:  A racing heart and shortness of breath.  Thoughts of hurting yourself or others. If you ever feel like you may hurt yourself or others, or have thoughts about taking your own life, get help right away. You can go to your nearest emergency department or call:  Your local emergency services (911 in the [place]  A suicide crisis helpline, such as the National Suicide Prevention Lifeline at 772-717-2061. This is open 24 hours a day. Summary  Taking steps to learn and use tension reduction techniques can help calm you and help prevent triggering an anxiety reaction.  When used together, medicines, psychotherapy, and tension reduction techniques may be the most effective treatment.  Family, friends, and partners can play a big part in helping you recover from an anxiety disorder. This information is not intended to replace advice given to you by your health care provider. Make sure you discuss any questions you have with your  health care provider. Document Revised: 12/02/2018 Document Reviewed: 12/02/2018 Elsevier Patient Education  2020 [employer].

## 2020-03-16 LAB — FERRITIN: Ferritin: 23 ng/mL (ref 15–150)

## 2020-03-16 LAB — CBC
Hematocrit: 28.3 % — ABNORMAL LOW (ref 34.0–46.6)
Hemoglobin: 8.9 g/dL — ABNORMAL LOW (ref 11.1–15.9)
MCH: 27.9 pg (ref 26.6–33.0)
MCHC: 31.4 g/dL — ABNORMAL LOW (ref 31.5–35.7)
MCV: 89 fL (ref 79–97)
Platelets: 316 10*3/uL (ref 150–450)
RBC: 3.19 x10E6/uL — ABNORMAL LOW (ref 3.77–5.28)
RDW: 14.5 % (ref 11.7–15.4)
WBC: 5 10*3/uL (ref 3.4–10.8)

## 2020-03-16 LAB — TSH: TSH: 1.74 u[IU]/mL (ref 0.450–4.500)

## 2020-03-23 ENCOUNTER — Telehealth: Payer: Self-pay | Admitting: Family

## 2020-03-23 ENCOUNTER — Other Ambulatory Visit: Payer: Self-pay

## 2020-03-23 ENCOUNTER — Other Ambulatory Visit: Payer: Self-pay | Admitting: Family

## 2020-03-23 ENCOUNTER — Inpatient Hospital Stay (HOSPITAL_BASED_OUTPATIENT_CLINIC_OR_DEPARTMENT_OTHER): Payer: 59 | Admitting: Family

## 2020-03-23 ENCOUNTER — Encounter: Payer: Self-pay | Admitting: Family

## 2020-03-23 ENCOUNTER — Inpatient Hospital Stay: Payer: 59 | Attending: Family

## 2020-03-23 VITALS — BP 116/78 | HR 81 | Temp 98.6°F | Resp 18 | Ht 64.0 in | Wt 136.4 lb

## 2020-03-23 DIAGNOSIS — R0602 Shortness of breath: Secondary | ICD-10-CM | POA: Diagnosis not present

## 2020-03-23 DIAGNOSIS — N921 Excessive and frequent menstruation with irregular cycle: Secondary | ICD-10-CM | POA: Diagnosis present

## 2020-03-23 DIAGNOSIS — Z79899 Other long term (current) drug therapy: Secondary | ICD-10-CM | POA: Insufficient documentation

## 2020-03-23 DIAGNOSIS — D500 Iron deficiency anemia secondary to blood loss (chronic): Secondary | ICD-10-CM | POA: Diagnosis present

## 2020-03-23 DIAGNOSIS — R5383 Other fatigue: Secondary | ICD-10-CM | POA: Diagnosis not present

## 2020-03-23 LAB — CBC WITH DIFFERENTIAL (CANCER CENTER ONLY)
Abs Immature Granulocytes: 0.05 10*3/uL (ref 0.00–0.07)
Basophils Absolute: 0.1 10*3/uL (ref 0.0–0.1)
Basophils Relative: 1 %
Eosinophils Absolute: 0.1 10*3/uL (ref 0.0–0.5)
Eosinophils Relative: 2 %
HCT: 31.4 % — ABNORMAL LOW (ref 36.0–46.0)
Hemoglobin: 9.7 g/dL — ABNORMAL LOW (ref 12.0–15.0)
Immature Granulocytes: 1 %
Lymphocytes Relative: 36 %
Lymphs Abs: 1.4 10*3/uL (ref 0.7–4.0)
MCH: 27.3 pg (ref 26.0–34.0)
MCHC: 30.9 g/dL (ref 30.0–36.0)
MCV: 88.5 fL (ref 80.0–100.0)
Monocytes Absolute: 0.4 10*3/uL (ref 0.1–1.0)
Monocytes Relative: 9 %
Neutro Abs: 2 10*3/uL (ref 1.7–7.7)
Neutrophils Relative %: 51 %
Platelet Count: 355 10*3/uL (ref 150–400)
RBC: 3.55 MIL/uL — ABNORMAL LOW (ref 3.87–5.11)
RDW: 14.6 % (ref 11.5–15.5)
WBC Count: 3.9 10*3/uL — ABNORMAL LOW (ref 4.0–10.5)
nRBC: 0 % (ref 0.0–0.2)

## 2020-03-23 LAB — RETICULOCYTES
Immature Retic Fract: 28.3 % — ABNORMAL HIGH (ref 2.3–15.9)
RBC.: 3.25 MIL/uL — ABNORMAL LOW (ref 3.87–5.11)
Retic Count, Absolute: 133.5 10*3/uL (ref 19.0–186.0)
Retic Ct Pct: 4.1 % — ABNORMAL HIGH (ref 0.4–3.1)

## 2020-03-23 NOTE — Telephone Encounter (Signed)
Appointments scheduled calendar printed per 9/8 los °

## 2020-03-23 NOTE — Progress Notes (Signed)
Hematology and Oncology Follow Up Visit  Faith Huff [id number] [birth year] 23 y.o. 03/23/2020   Principle Diagnosis:  Iron deficiency anemia secondary to menometrorrhagia  Current Therapy: IV iron as indicated    Interim History:  Faith Huff is here today for follow-up. She is feeling fatigued, SOB with exertion and has noted intermittent tingling in her fingertips and feet.  She states that she stopped the Depo and 2 weeks ago had heavy bleeding for about 2 days with lots of clots. She is only spotting very lightly at this time.  No other blood loss noted. No bruising or petechiae.  No fever, chills, n/v, cough, rash, dizziness, chest pain, palpitations, abdominal pain or changes in bowel or bladder habits. No swelling in her extremities.  No falls or syncope.  Her appetite is good but she admits that she needs to hydrate more throughout the day. Her weight is stable at 136 lbs.   ECOG Performance Status: 1 - Symptomatic but completely ambulatory  Medications:  Allergies as of 03/23/2020   No Known Allergies     Medication List       Accurate as of March 23, 2020  2:04 PM. If you have any questions, ask your nurse or doctor.        citalopram 20 MG tablet Commonly known as: CELEXA Take 1/2 tablet a day for one week, if tolerating then increase to one tablet a day   IRON PO Take 45 mg by mouth.   norethindrone 5 MG tablet Commonly known as: AYGESTIN Take one tablet po TID until all bleeding stops, then go to BID for 2 days, if still not bleeding then change to one tablet a day       Allergies: No Known Allergies  Past Medical History, Surgical history, Social history, and Family History were reviewed and updated.  Review of Systems: All other 10 point review of systems is negative.   Physical Exam:  vitals were not taken for this visit.   Wt Readings from Last 3 Encounters:  03/15/20 132 lb 12.8 oz (60.2 kg)  12/29/19 139 lb (63 kg)    12/07/19 138 lb (62.6 kg)    Ocular: Sclerae unicteric, pupils equal, round and reactive to light Ear-nose-throat: Oropharynx clear, dentition fair Lymphatic: No cervical or supraclavicular adenopathy Lungs no rales or rhonchi, good excursion bilaterally Heart regular rate and rhythm, no murmur appreciated Abd soft, nontender, positive bowel sounds MSK no focal spinal tenderness, no joint edema Neuro: non-focal, well-oriented, appropriate affect Breasts: Deferred   Lab Results  Component Value Date   WBC 3.9 (L) 03/23/2020   HGB 9.7 (L) 03/23/2020   HCT 31.4 (L) 03/23/2020   MCV 88.5 03/23/2020   PLT 355 03/23/2020   Lab Results  Component Value Date   FERRITIN 23 03/15/2020   IRON 15 (L) 12/28/2019   TIBC 355 12/28/2019   UIBC 340 12/28/2019   IRONPCTSAT 4 (L) 12/28/2019   Lab Results  Component Value Date   RETICCTPCT 4.1 (H) 03/23/2020   RBC 3.25 (L) 03/23/2020   No results found for: KPAFRELGTCHN, LAMBDASER, KAPLAMBRATIO No results found for: IGGSERUM, IGA, IGMSERUM No results found for: Faith Huff, A1GS, A2GS, Colin Benton, MSPIKE, SPEI   Chemistry      Component Value Date/Time   NA 141 10/16/2019 1256   NA 138 04/13/2019 1010   K 3.6 10/16/2019 1256   CL 107 10/16/2019 1256   CO2 26 10/16/2019 1256   BUN 10 10/16/2019 1256  BUN 5 (L) 04/13/2019 1010   CREATININE 0.74 10/16/2019 1256      Component Value Date/Time   CALCIUM 9.4 10/16/2019 1256   ALKPHOS 47 10/16/2019 1256   AST 17 10/16/2019 1256   ALT 12 10/16/2019 1256   BILITOT 0.4 10/16/2019 1256       Impression and Plan: Faith Huff is a very pleasant 24 yo African American female with iron deficiency anemia secondary to heavy cycles. We will see what her iron studies look like and bring her back in for replacement if needed.  We will plan to see her again in another 3 months.  She can contact our office with any questions or concerns.   Faith Gins,  NP 9/8/20212:04 PM

## 2020-03-24 LAB — IRON AND TIBC
Iron: 21 ug/dL — ABNORMAL LOW (ref 41–142)
Saturation Ratios: 7 % — ABNORMAL LOW (ref 21–57)
TIBC: 293 ug/dL (ref 236–444)
UIBC: 272 ug/dL (ref 120–384)

## 2020-03-24 LAB — FERRITIN: Ferritin: 24 ng/mL (ref 11–307)

## 2020-03-28 ENCOUNTER — Telehealth: Payer: Self-pay | Admitting: Family

## 2020-03-28 NOTE — Telephone Encounter (Signed)
I called and spoke with patient regarding appointments added per 9/10 sch msg.  She was ok with both dates & times

## 2020-04-01 ENCOUNTER — Other Ambulatory Visit: Payer: Self-pay

## 2020-04-01 ENCOUNTER — Inpatient Hospital Stay: Payer: 59

## 2020-04-01 ENCOUNTER — Other Ambulatory Visit: Payer: Self-pay | Admitting: Family

## 2020-04-01 VITALS — BP 102/63 | HR 79 | Temp 98.3°F | Resp 18

## 2020-04-01 DIAGNOSIS — D509 Iron deficiency anemia, unspecified: Secondary | ICD-10-CM

## 2020-04-01 DIAGNOSIS — N921 Excessive and frequent menstruation with irregular cycle: Secondary | ICD-10-CM | POA: Diagnosis not present

## 2020-04-01 MED ORDER — SODIUM CHLORIDE 0.9 % IV SOLN
510.0000 mg | Freq: Once | INTRAVENOUS | Status: AC
  Administered 2020-04-01: 510 mg via INTRAVENOUS
  Filled 2020-04-01: qty 17

## 2020-04-01 MED ORDER — SODIUM CHLORIDE 0.9 % IV SOLN
Freq: Once | INTRAVENOUS | Status: AC
  Filled 2020-04-01: qty 250

## 2020-04-01 MED ORDER — MONTELUKAST SODIUM 10 MG PO TABS
10.0000 mg | ORAL_TABLET | Freq: Every day | ORAL | Status: DC
  Administered 2020-04-01: 10 mg via ORAL
  Filled 2020-04-01: qty 1

## 2020-04-01 NOTE — Patient Instructions (Signed)

## 2020-04-01 NOTE — Progress Notes (Signed)
Patient states her chest started to hurt. Patient states she felt the sensation more when trying to take a deep breath in. VSS. Emeline Gins, NP notified. Order given and carried out for Pepcid 40 mg PO.

## 2020-04-01 NOTE — Progress Notes (Signed)
Pharmacy does not have 20 mg Pepcid oral. Give singulair 10 mg PO per [facility]. Vital signs stable. Patient is to go home rest and hydrate. Patient states she is feeling better. Patient verbalized understanding she is to call our office with any questions or concerns.

## 2020-04-01 NOTE — Addendum Note (Signed)
Addended by: Corky Sing on: 04/01/2020 02:09 PM   Modules accepted: Orders

## 2020-04-08 ENCOUNTER — Inpatient Hospital Stay: Payer: 59

## 2020-04-08 ENCOUNTER — Other Ambulatory Visit: Payer: Self-pay

## 2020-04-08 VITALS — BP 93/65 | HR 88 | Temp 98.7°F | Resp 17

## 2020-04-08 DIAGNOSIS — D509 Iron deficiency anemia, unspecified: Secondary | ICD-10-CM

## 2020-04-08 DIAGNOSIS — N921 Excessive and frequent menstruation with irregular cycle: Secondary | ICD-10-CM | POA: Diagnosis not present

## 2020-04-08 MED ORDER — MONTELUKAST SODIUM 10 MG PO TABS
10.0000 mg | ORAL_TABLET | Freq: Every day | ORAL | Status: DC
  Administered 2020-04-08: 10 mg via ORAL
  Filled 2020-04-08: qty 1

## 2020-04-08 MED ORDER — SODIUM CHLORIDE 0.9 % IV SOLN
510.0000 mg | Freq: Once | INTRAVENOUS | Status: AC
  Administered 2020-04-08: 510 mg via INTRAVENOUS
  Filled 2020-04-08: qty 17

## 2020-04-08 NOTE — Patient Instructions (Signed)

## 2020-04-12 ENCOUNTER — Ambulatory Visit: Payer: Self-pay | Admitting: Obstetrics and Gynecology

## 2020-04-12 ENCOUNTER — Telehealth: Payer: Self-pay

## 2020-04-12 ENCOUNTER — Encounter: Payer: Self-pay | Admitting: Obstetrics and Gynecology

## 2020-04-12 NOTE — Telephone Encounter (Signed)
Pt has AEX scheduled for 04/18/20 with Dr Oscar La.  Encounter closed

## 2020-04-12 NOTE — Telephone Encounter (Signed)
Patient did not keep follow up visit for today.

## 2020-04-15 ENCOUNTER — Inpatient Hospital Stay: Payer: 59

## 2020-04-15 ENCOUNTER — Inpatient Hospital Stay: Payer: 59 | Admitting: Family

## 2020-04-18 ENCOUNTER — Ambulatory Visit: Payer: BC Managed Care – PPO | Admitting: Obstetrics and Gynecology

## 2020-05-06 ENCOUNTER — Other Ambulatory Visit: Payer: Self-pay

## 2020-05-09 ENCOUNTER — Other Ambulatory Visit: Payer: Self-pay | Admitting: Family

## 2020-05-24 ENCOUNTER — Telehealth: Payer: Self-pay

## 2020-05-24 NOTE — Telephone Encounter (Signed)
Called and left a vm with new r/s appt from 06/22/20 to 06/24/20.Marland KitchenMarland KitchenAOM

## 2020-06-08 ENCOUNTER — Telehealth: Payer: Self-pay

## 2020-06-08 NOTE — Telephone Encounter (Signed)
Pt called in to cancel her 06/24/20 appts and request to call back to r/s.,,,,,,, AOM

## 2020-06-22 ENCOUNTER — Ambulatory Visit: Payer: 59 | Admitting: Family

## 2020-06-22 ENCOUNTER — Other Ambulatory Visit: Payer: 59

## 2020-06-24 ENCOUNTER — Ambulatory Visit: Payer: Self-pay | Admitting: Family

## 2020-06-24 ENCOUNTER — Other Ambulatory Visit: Payer: Self-pay

## 2020-11-07 IMAGING — US US BREAST*R* LIMITED INC AXILLA
1 series · 6 of 6 positions shown · non-contrast
Comparison: None.

CLINICAL DATA: 23-year-old with a palpable lump in the LOWER OUTER
periareolar RIGHT breast which she states has been present for many
years without change. The palpable lump was recently confirmed on
clinical breast examination.

Patient states no family history of breast cancer
EXAM:
ULTRASOUND OF THE RIGHT BREAST

[Series 1: us breast*right* limited inc axilla · 0.05mm/px · 6 of 6 slices shown]
[im 1/6]
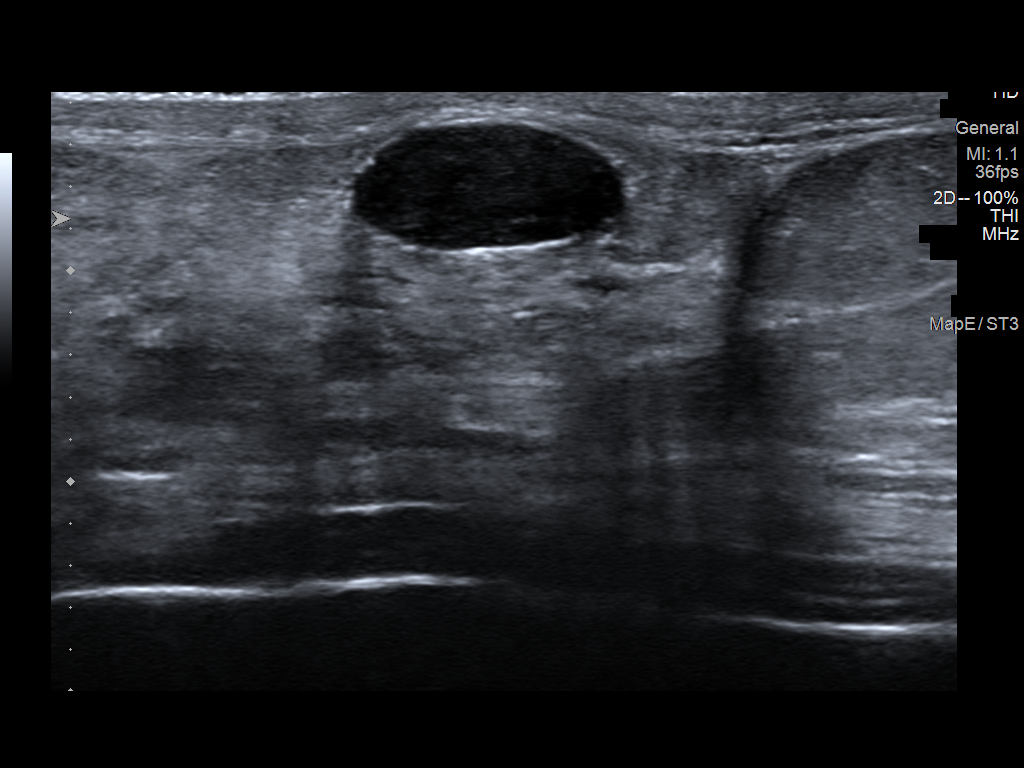
[im 2/6]
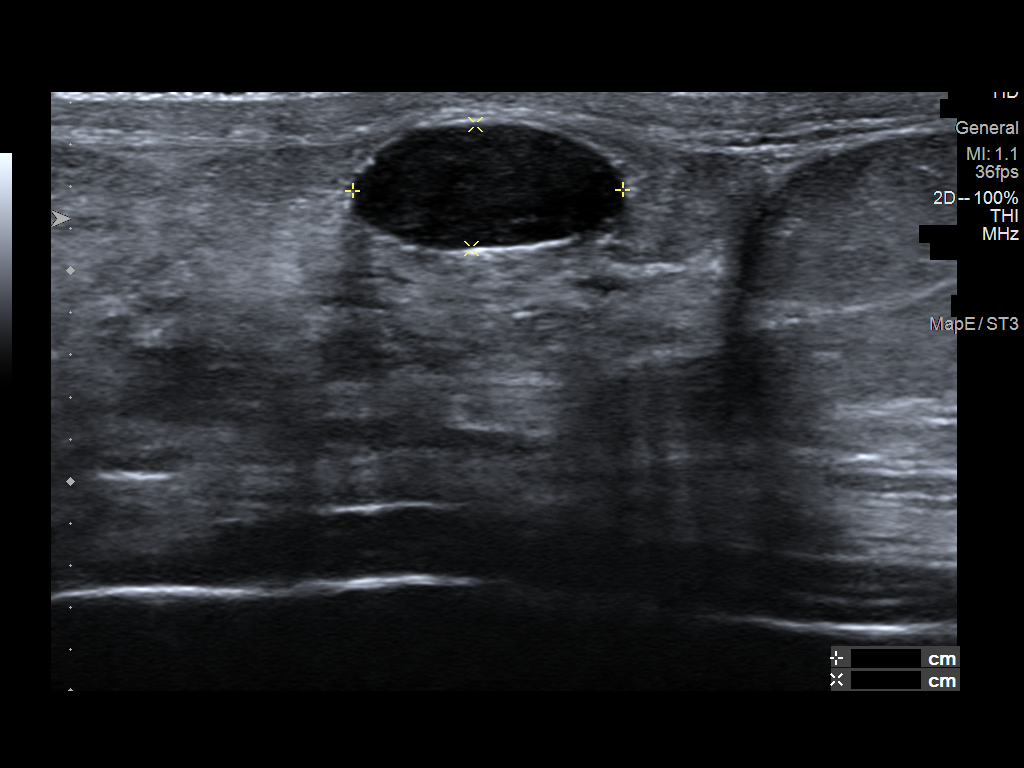
[im 3/6]
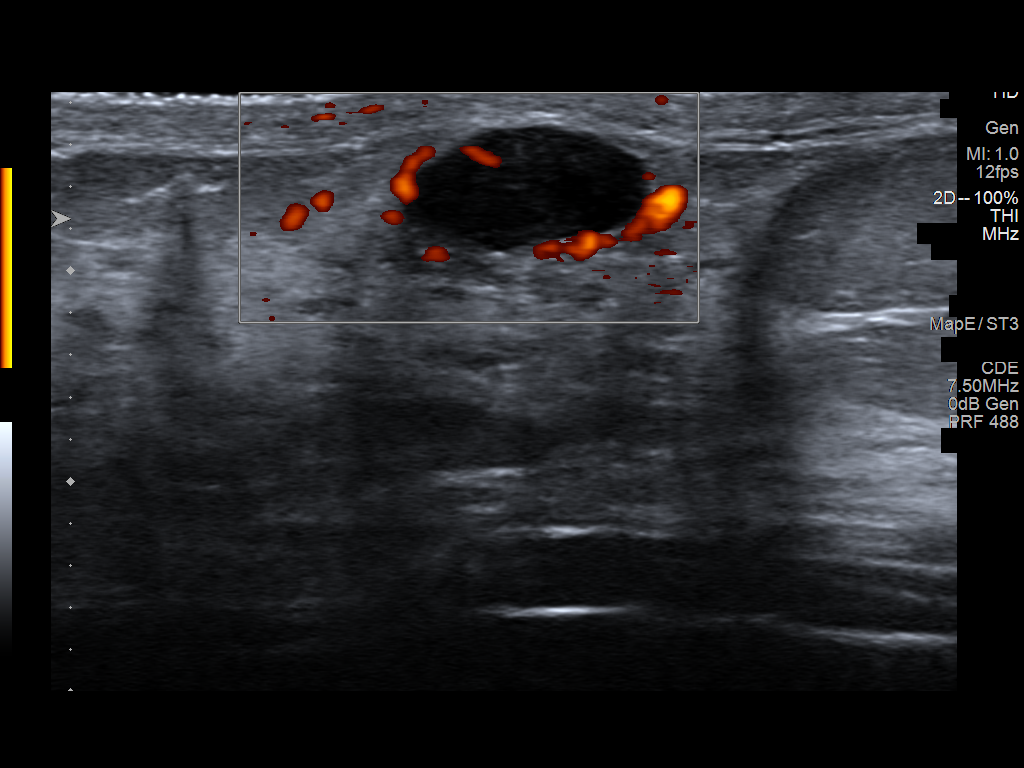
[im 4/6]
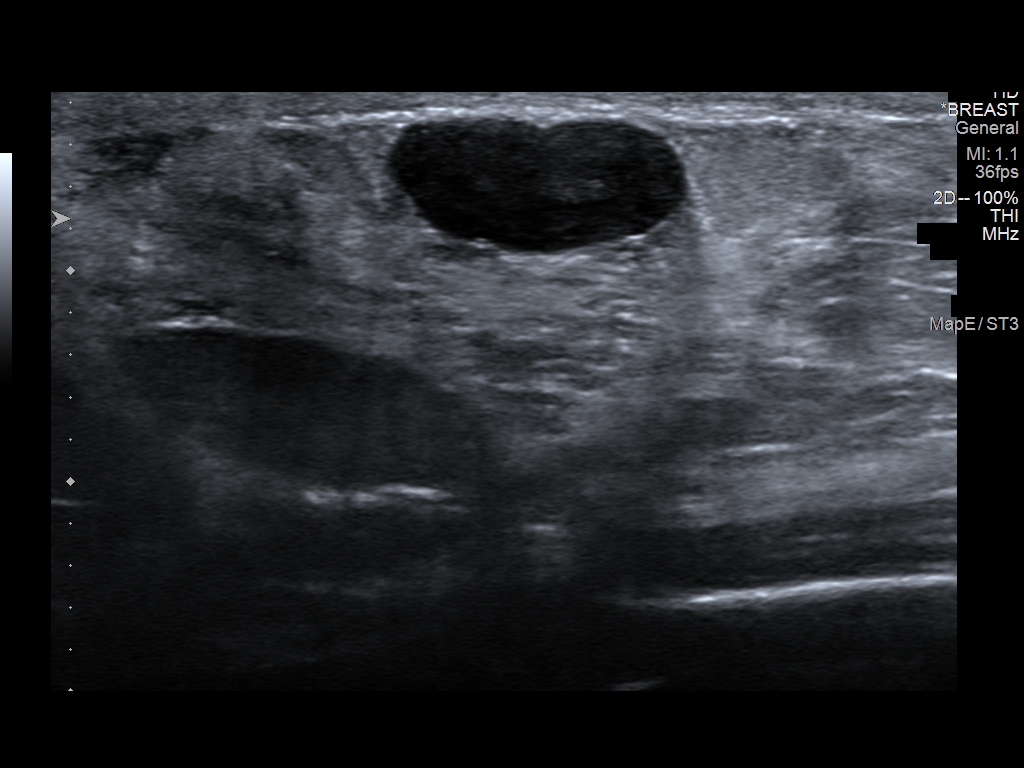
[im 5/6]
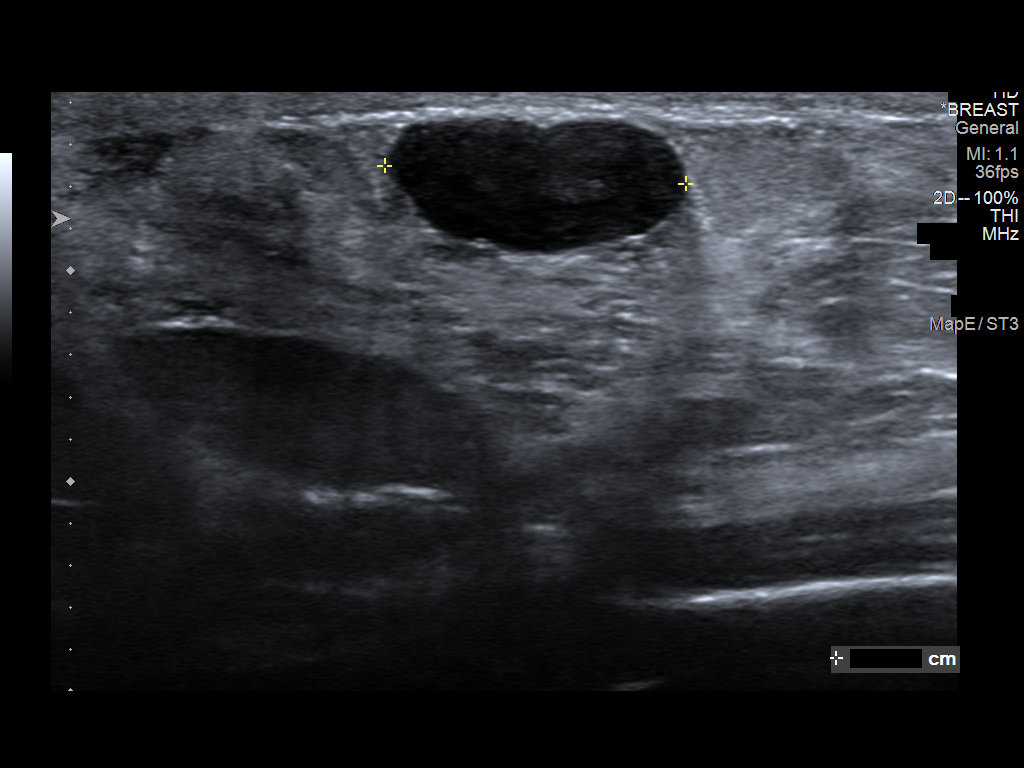
[im 6/6]
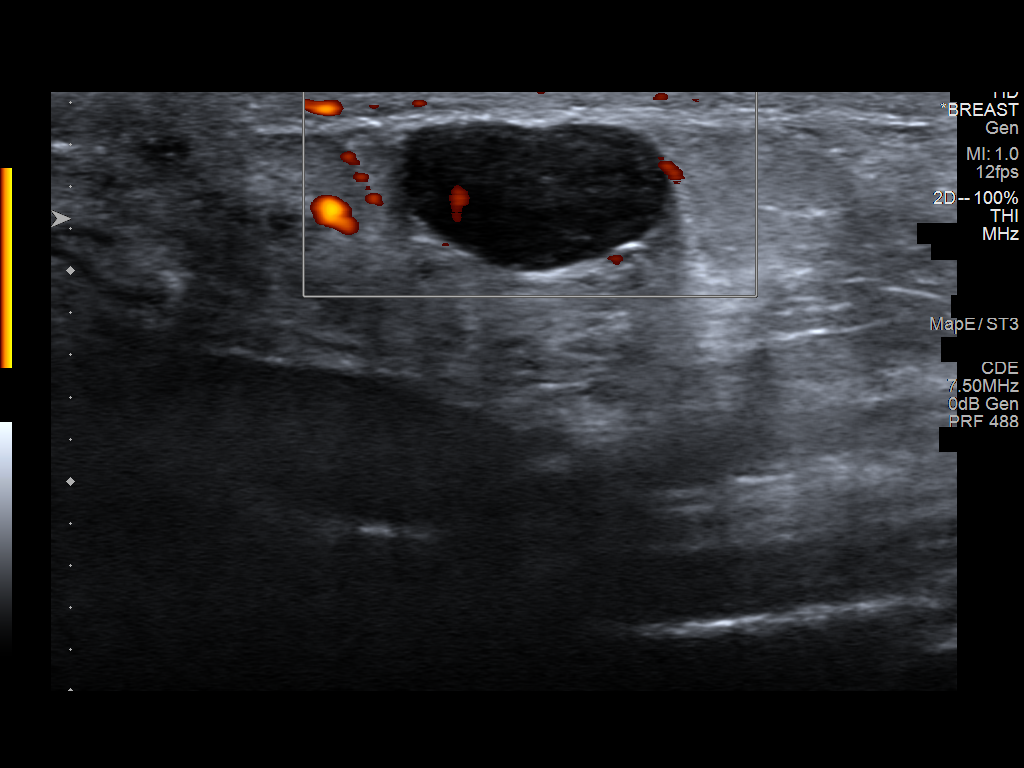

[6 of 6 positions shown; findings below may reference images not displayed]

FINDINGS: On physical exam, there is a freely mobile palpable approximate to 2
cm lump in the LOWER OUTER periareolar RIGHT breast.

Targeted RIGHT breast ultrasound is performed, showing an oval
parallel circumscribed hypoechoic mass at the 7 o'clock position
approximately 2 cm from nipple measuring approximately 0.6 x 1.3 x
1.4 cm, demonstrating no posterior characteristics and demonstrating
internal power Doppler flow, corresponding to the palpable concern.
IMPRESSION: Likely benign 1.4 cm fibroadenoma involving LOWER OUTER periareolar
RIGHT breast which accounts for the palpable concern.

RECOMMENDATION:
We discussed management options for the likely benign fibroadenoma
including excision, ultrasound-guided core biopsy, and short term
interval follow-up. Follow-up ultrasound is recommended at 6, 12 and
24 months to assess stability. The patient agrees with this plan.

I have discussed the findings and recommendations with the patient.
If applicable, a reminder letter will be sent to the patient
regarding the next appointment.

BI-RADS CATEGORY  3: Probably benign.

## 2021-03-22 ENCOUNTER — Encounter: Payer: Self-pay | Admitting: Family

## 2021-05-05 ENCOUNTER — Encounter: Payer: Self-pay | Admitting: Family

## 2021-05-08 ENCOUNTER — Encounter: Payer: Self-pay | Admitting: Family

## 2021-05-08 IMAGING — US US BREAST*R* LIMITED INC AXILLA
1 series · 6 of 6 positions shown · non-contrast
Comparison: 05/05/2019.

CLINICAL DATA: Six-month interval follow-up of a likely benign
palpable mass involving the LOWER OUTER QUADRANT of the RIGHT
breast, likely a fibroadenoma.

EXAM:
ULTRASOUND OF THE RIGHT BREAST

[Series 1: us breast*right* limited inc axilla · 0.06mm/px · 6 of 6 slices shown]
[im 1/6]
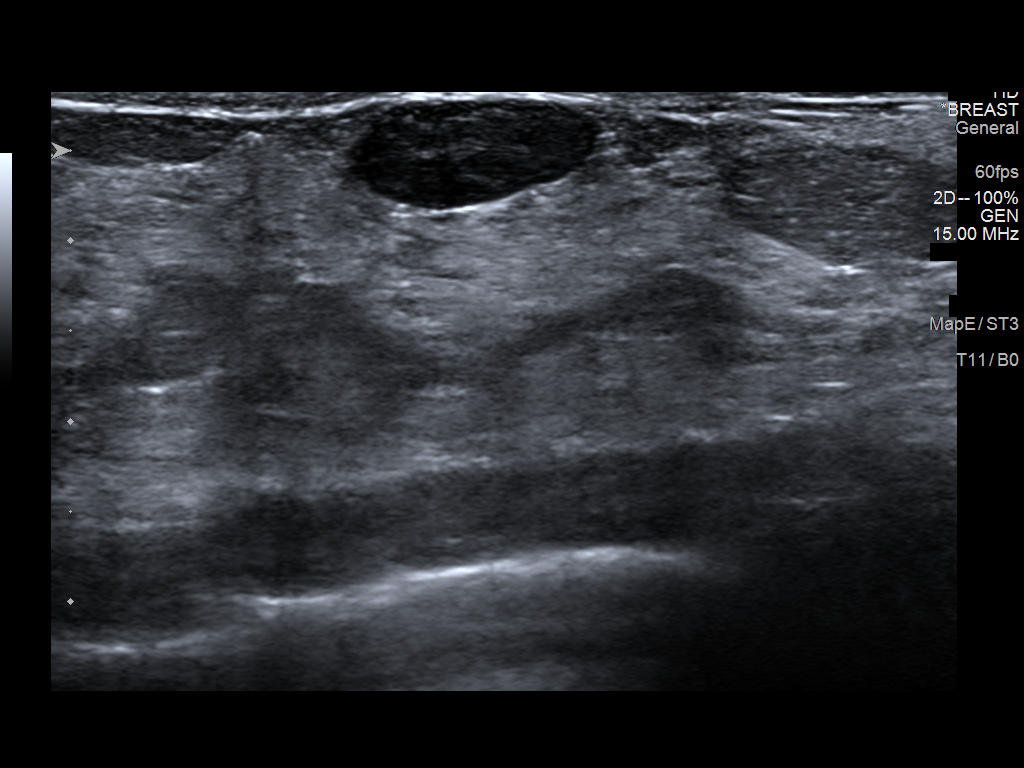
[im 2/6]
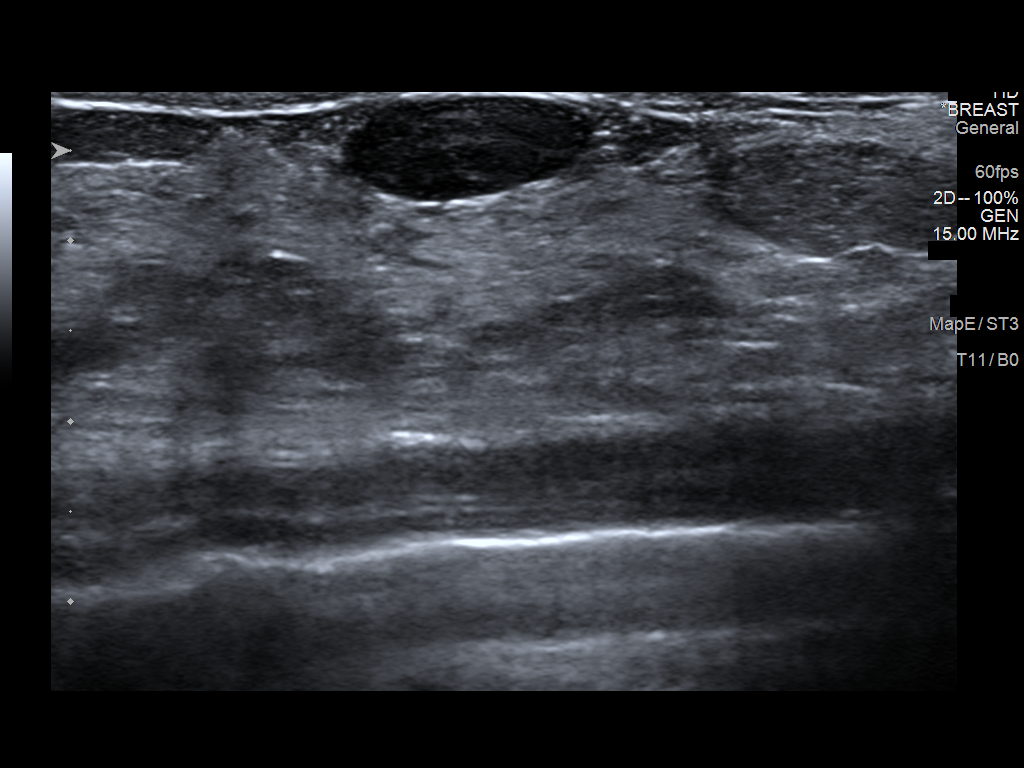
[im 3/6]
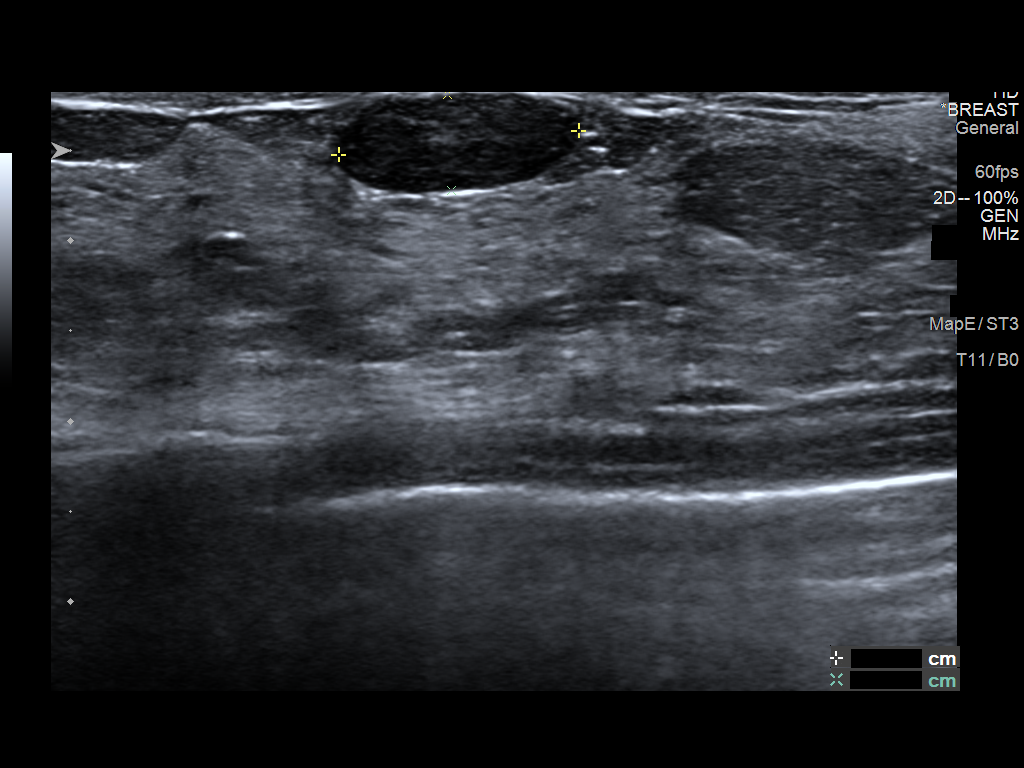
[im 4/6]
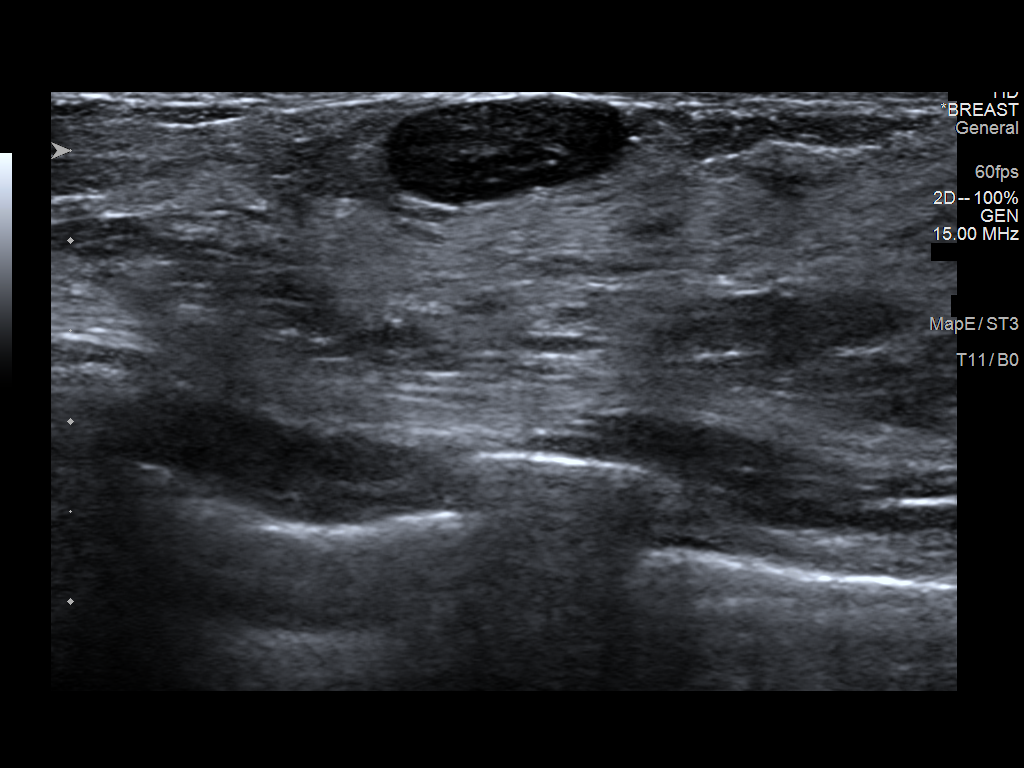
[im 5/6]
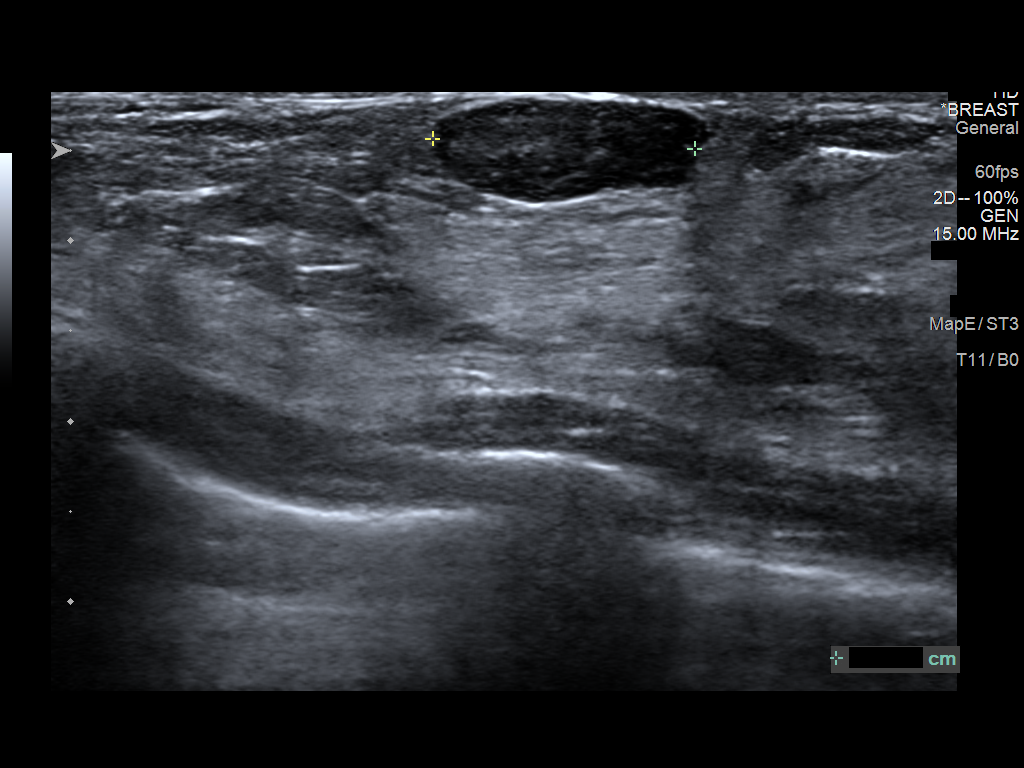
[im 6/6]
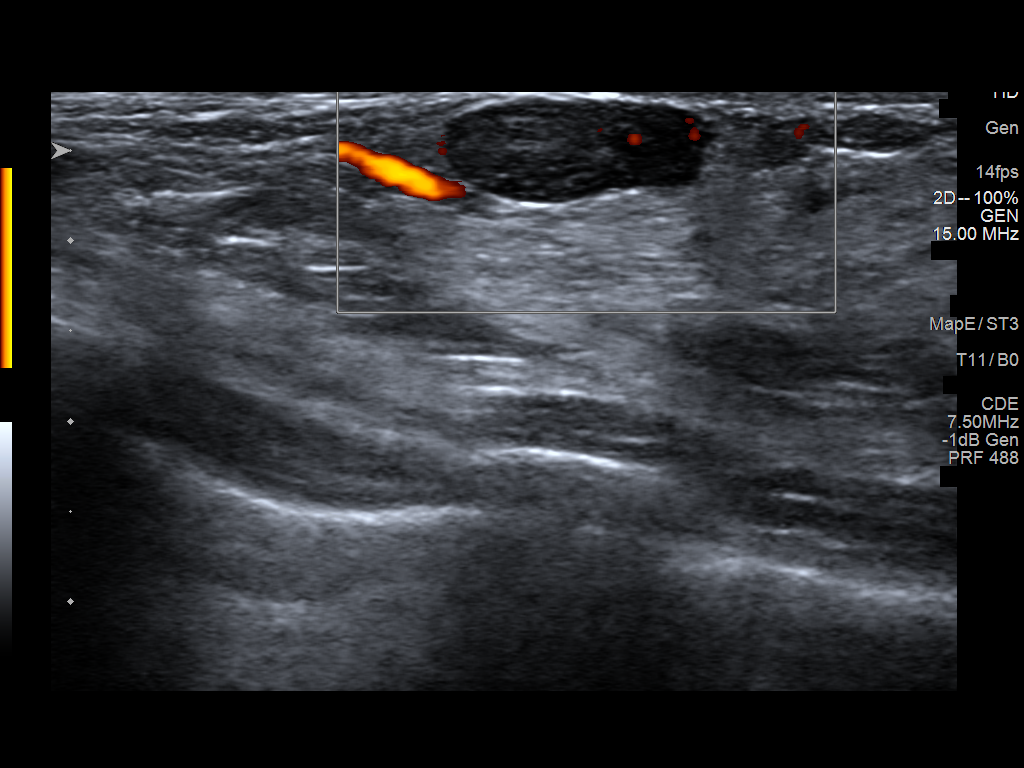

[6 of 6 positions shown; findings below may reference images not displayed]

FINDINGS: Targeted RIGHT breast ultrasound is performed, showing the
previously identified circumscribed oval parallel hypoechoic mass
superficially at the 7 o'clock position approximately 2 cm from the
nipple, measuring approximately 1.5 x 0.5 x 1.3 cm (previously 1.4 x
0.6 x 1.3 cm), demonstrating slight posterior acoustic enhancement
and demonstrating internal power Doppler flow.
IMPRESSION: Stable likely benign fibroadenoma involving the LOWER OUTER QUADRANT
of the RIGHT breast at the 7 o'clock position approximately 2 cm
from the nipple.

RECOMMENDATION:
RIGHT breast ultrasound in 6 months.

I have discussed the findings and recommendations with the patient.
If applicable, a reminder letter will be sent to the patient
regarding the next appointment.

BI-RADS CATEGORY  3: Probably benign.

## 2021-05-09 ENCOUNTER — Encounter: Payer: Self-pay | Admitting: Family

## 2021-12-12 ENCOUNTER — Encounter: Payer: Self-pay | Admitting: Family

## 2022-05-08 ENCOUNTER — Encounter: Payer: Self-pay | Admitting: Family

## 2022-05-10 ENCOUNTER — Encounter: Payer: Self-pay | Admitting: Family

## 2022-05-25 ENCOUNTER — Encounter: Payer: Self-pay | Admitting: Family

## 2022-06-25 ENCOUNTER — Encounter: Payer: Self-pay | Admitting: Family

## 2022-07-18 ENCOUNTER — Encounter: Payer: Self-pay | Admitting: Family

## 2022-07-24 ENCOUNTER — Encounter: Payer: Self-pay | Admitting: Family

## 2022-08-02 ENCOUNTER — Encounter: Payer: Self-pay | Admitting: Family

## 2022-08-21 ENCOUNTER — Encounter: Payer: Self-pay | Admitting: Family

## 2022-08-24 ENCOUNTER — Encounter: Payer: Self-pay | Admitting: Family

## 2022-09-10 ENCOUNTER — Encounter: Payer: Self-pay | Admitting: Family

## 2022-11-07 ENCOUNTER — Encounter: Payer: Self-pay | Admitting: Family

## 2022-11-19 ENCOUNTER — Encounter: Payer: Self-pay | Admitting: Family
